# Patient Record
Sex: Female | Born: 1980 | Race: White | Hispanic: No | Marital: Married | Smoking: Never smoker
Health system: Southern US, Community
[De-identification: ages and names within clinical notes are randomized; demographics above are authoritative.]

## PROBLEM LIST (undated history)

## (undated) DIAGNOSIS — Z8481 Family history of carrier of genetic disease: Secondary | ICD-10-CM

## (undated) DIAGNOSIS — O24419 Gestational diabetes mellitus in pregnancy, unspecified control: Secondary | ICD-10-CM

## (undated) DIAGNOSIS — Z803 Family history of malignant neoplasm of breast: Secondary | ICD-10-CM

## (undated) DIAGNOSIS — E119 Type 2 diabetes mellitus without complications: Secondary | ICD-10-CM

## (undated) DIAGNOSIS — I10 Essential (primary) hypertension: Secondary | ICD-10-CM

## (undated) HISTORY — DX: Family history of malignant neoplasm of breast: Z80.3

## (undated) HISTORY — PX: TONSILLECTOMY: SUR1361

## (undated) HISTORY — DX: Family history of carrier of genetic disease: Z84.81

---
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
## 2004-05-06 ENCOUNTER — Ambulatory Visit: Payer: Self-pay | Admitting: Internal Medicine

## 2004-06-10 ENCOUNTER — Emergency Department: Payer: Self-pay | Admitting: Emergency Medicine

## 2004-10-22 ENCOUNTER — Ambulatory Visit: Payer: Self-pay | Admitting: Internal Medicine

## 2009-05-14 ENCOUNTER — Ambulatory Visit: Payer: Self-pay

## 2009-06-22 ENCOUNTER — Observation Stay: Payer: Self-pay | Admitting: Obstetrics & Gynecology

## 2009-06-25 ENCOUNTER — Inpatient Hospital Stay: Payer: Self-pay | Admitting: Obstetrics & Gynecology

## 2009-06-25 ENCOUNTER — Ambulatory Visit: Payer: Self-pay | Admitting: Obstetrics & Gynecology

## 2009-07-03 ENCOUNTER — Inpatient Hospital Stay: Payer: Self-pay | Admitting: Obstetrics and Gynecology

## 2010-10-09 LAB — HM PAP SMEAR

## 2011-12-27 ENCOUNTER — Observation Stay: Payer: Self-pay | Admitting: Obstetrics and Gynecology

## 2011-12-27 LAB — CBC WITH DIFFERENTIAL/PLATELET
Basophil %: 0.2 %
Eosinophil #: 0.1 10*3/uL (ref 0.0–0.7)
Eosinophil %: 0.7 %
Lymphocyte #: 2.2 10*3/uL (ref 1.0–3.6)
MCH: 32.5 pg (ref 26.0–34.0)
MCHC: 34.7 g/dL (ref 32.0–36.0)
MCV: 94 fL (ref 80–100)
Monocyte #: 0.9 x10 3/mm (ref 0.2–0.9)
Platelet: 196 10*3/uL (ref 150–440)
RDW: 13.8 % (ref 11.5–14.5)

## 2012-01-01 ENCOUNTER — Observation Stay: Payer: Self-pay

## 2012-01-01 LAB — CREATININE, URINE, RANDOM: Creatinine, Urine Random: 45 mg/dL (ref 30.0–125.0)

## 2012-01-02 LAB — BASIC METABOLIC PANEL
BUN: 9 mg/dL (ref 7–18)
Calcium, Total: 9.2 mg/dL (ref 8.5–10.1)
Chloride: 105 mmol/L (ref 98–107)
Creatinine: 0.62 mg/dL (ref 0.60–1.30)
EGFR (Non-African Amer.): >60
Glucose: 59 mg/dL — ABNORMAL LOW (ref 65–99)
Osmolality: 276 (ref 275–301)
Potassium: 3.4 mmol/L — ABNORMAL LOW (ref 3.5–5.1)
Sodium: 140 mmol/L (ref 136–145)

## 2012-01-02 LAB — HEMOGLOBIN: HGB: 12.3 g/dL (ref 12.0–16.0)

## 2012-01-02 LAB — URIC ACID: Uric Acid: 3.2 mg/dL (ref 2.6–6.0)

## 2012-01-02 LAB — SGOT (AST)(ARMC): SGOT(AST): 18 U/L (ref 15–37)

## 2012-01-02 LAB — WBC: WBC: 13.5 10*3/uL — ABNORMAL HIGH (ref 3.6–11.0)

## 2012-01-08 ENCOUNTER — Inpatient Hospital Stay: Payer: Self-pay | Admitting: Obstetrics and Gynecology

## 2012-01-08 LAB — CBC WITH DIFFERENTIAL/PLATELET
Basophil #: 0 10*3/uL (ref 0.0–0.1)
Basophil %: 0.1 %
Eosinophil %: 0.4 %
HCT: 33.2 % — ABNORMAL LOW (ref 35.0–47.0)
HGB: 12.2 g/dL (ref 12.0–16.0)
Lymphocyte #: 2.6 10*3/uL (ref 1.0–3.6)
Lymphocyte %: 24 %
MCHC: 36.7 g/dL — ABNORMAL HIGH (ref 32.0–36.0)
MCV: 103 fL — ABNORMAL HIGH (ref 80–100)
Monocyte #: 0.7 x10 3/mm (ref 0.2–0.9)
Monocyte %: 6.8 %
Neutrophil #: 7.4 10*3/uL — ABNORMAL HIGH (ref 1.4–6.5)
Platelet: 219 10*3/uL (ref 150–440)
RBC: 3.21 10*6/uL — ABNORMAL LOW (ref 3.80–5.20)
RDW: 13.6 % (ref 11.5–14.5)
WBC: 10.7 10*3/uL (ref 3.6–11.0)

## 2012-06-16 DIAGNOSIS — Z1371 Encounter for nonprocreative screening for genetic disease carrier status: Secondary | ICD-10-CM

## 2012-06-16 HISTORY — DX: Encounter for nonprocreative screening for genetic disease carrier status: Z13.71

## 2013-03-22 LAB — HM PAP SMEAR

## 2014-10-24 NOTE — H&P (Signed)
L&D Evaluation:  History Expanded:   HPI 34 yo G2P1001 at 37w [facility] 8wk US with GDM this pregnancy, Currently on glyburide 2.5 mg BID with fair control of BG.  Growth has been appropriate with normal AFIs. Pt had version for breech presentation on 7/13. Pt presents this evening with c/o regular cramps - approx q 10 minutes. OBHx notable for IOL for pre-eclampsia with rapid progression from 3 to 10 cm after AROM.    Blood Type (Maternal) O positive    Group B Strep Results Maternal (Result >5wks must be treated as unknown) negative    Maternal HIV Negative    Maternal Syphilis Ab Nonreactive    Maternal Varicella Non-Immune    Rubella Results (Maternal) immune    Presents with contractions    Patient's Medical History No Chronic Illness    Patient's Surgical History anterior vaginal repair    Medications Pre Natal Vitamins  started on glyburide 2.5mg  bid (decreased am dose to 1.25 mg qam on 7/18    Allergies NKDA    Social History none    Family History Non-Contributory   ROS:   ROS All systems were reviewed.  HEENT, CNS, GI, GU, Respiratory, CV, Renal and Musculoskeletal systems were found to be normal.   Exam:   Vital Signs 140s/80s    Urine Protein negative dipstick    General no apparent distress    Mental Status clear    Chest clear    Heart no murmur/gallop/rubs    Abdomen gravid, tender with contractions    Estimated Fetal Weight Average for gestational age    Edema no edema  area of varicose veins on right leg    Reflexes 2+    Clonus negative    Pelvic 5/90/-2    Mebranes Intact    FHT normal rate with no decels    Ucx regular, q 3-10 min   Impression:   Impression IUP at 37 weeks with contractions, elevated BPs   Plan:   Plan monitor contractions and for cervical change, monitor BP, PIH panel   Electronic Signatures: Vella KohlerBrothers, Kagen Kunath K (CNM)  (Signed 18-Jul-13 23:21)  Authored: L&D Evaluation   Last  Updated: 18-Jul-13 23:21 by Vella KohlerBrothers, Emelyn Roen K (CNM)

## 2014-10-24 NOTE — H&P (Signed)
L&D Evaluation:  History:   HPI 34 yo G2P1001 at 7657w2d [facility] with GDM this pregnancy noted to be breech since [redacted] weeks gestation and has not spontaneously verted.  Currently on glyburide with fair control of BG.  Growth has been appropriate with normal AFI yesterday.    Presents with Version    Patient's Medical History No Chronic Illness    Patient's Surgical History anterior vaginal repair    Medications Pre Natal Vitamins  started on glyburide 2.5mg  bid 7/12    Allergies NKDA    Social History none    Family History Non-Contributory   ROS:   ROS All systems were reviewed.  HEENT, CNS, GI, GU, Respiratory, CV, Renal and Musculoskeletal systems were found to be normal.   Exam:   Vital Signs stable    General no apparent distress    Abdomen gravid, non-tender    Estimated Fetal Weight Average for gestational age, breech    Edema no edema    Mebranes Intact    FHT normal rate with no decels    Ucx absent    Other Transabdominal US reveals single viable IUP in frank breech position, AFI of 16.09cm, posterior fundal placenta   Impression:   Impression Breech   Plan:   Comments - Obtain CBC - Administer 0.25mg  SQ once prior to procedure - Will monitor for 1-hr post procedure if no evidence of contractions and reassuing fetal surveillance may discharge home   Electronic Signatures: Lorrene ReidStaebler, Reg Bircher M (MD)  (Signed 13-Jul-13 10:36)  Authored: L&D Evaluation   Last Updated: 13-Jul-13 10:36 by Lorrene ReidStaebler, [place] Leinen M (MD)

## 2015-08-07 ENCOUNTER — Ambulatory Visit
Admission: EM | Admit: 2015-08-07 | Discharge: 2015-08-07 | Disposition: A | Discharge status: Home / Self Care | Payer: Self-pay | Attending: Family Medicine | Admitting: Family Medicine

## 2015-08-07 DIAGNOSIS — K219 Gastro-esophageal reflux disease without esophagitis: Secondary | ICD-10-CM

## 2015-08-07 HISTORY — DX: Gestational diabetes mellitus in pregnancy, unspecified control: O24.419

## 2015-08-07 LAB — COMPREHENSIVE METABOLIC PANEL
ALT: 42 U/L (ref 14–54)
AST: 25 U/L (ref 15–41)
Albumin: 4.3 g/dL (ref 3.5–5.0)
Alkaline Phosphatase: 134 U/L — ABNORMAL HIGH (ref 38–126)
Anion gap: 6 (ref 5–15)
BUN: 11 mg/dL (ref 6–20)
CO2: 27 mmol/L (ref 22–32)
Calcium: 8.8 mg/dL — ABNORMAL LOW (ref 8.9–10.3)
Chloride: 103 mmol/L (ref 101–111)
Creatinine, Ser: 0.67 mg/dL (ref 0.44–1.00)
GFR calc Af Amer: >60 mL/min (ref >60)
GFR calc non Af Amer: >60 mL/min (ref >60)
Glucose, Bld: 94 mg/dL (ref 65–99)
Potassium: 3.8 mmol/L (ref 3.5–5.1)
Sodium: 136 mmol/L (ref 135–145)
Total Bilirubin: 0.5 mg/dL (ref 0.3–1.2)
Total Protein: 8.8 g/dL — ABNORMAL HIGH (ref 6.5–8.1)

## 2015-08-07 LAB — URINALYSIS COMPLETE WITH MICROSCOPIC (ARMC ONLY)
BILIRUBIN URINE: NEGATIVE
GLUCOSE, UA: NEGATIVE mg/dL
Ketones, ur: NEGATIVE mg/dL
LEUKOCYTES UA: NEGATIVE
Nitrite: NEGATIVE
PH: 5.5 (ref 5.0–8.0)
Protein, ur: NEGATIVE mg/dL
Specific Gravity, Urine: 1.015 (ref 1.005–1.030)
WBC UA: NONE SEEN WBC/hpf (ref 0–5)

## 2015-08-07 LAB — CBC WITH DIFFERENTIAL/PLATELET
Basophils Absolute: 0.1 10*3/uL (ref 0–0.1)
Basophils Relative: 1 %
Eosinophils Absolute: 0.2 10*3/uL (ref 0–0.7)
Eosinophils Relative: 2 %
HCT: 42.5 % (ref 35.0–47.0)
Hemoglobin: 14.4 g/dL (ref 12.0–16.0)
Lymphocytes Relative: 40 %
Lymphs Abs: 4.2 10*3/uL — ABNORMAL HIGH (ref 1.0–3.6)
MCH: 30.1 pg (ref 26.0–34.0)
MCHC: 34 g/dL (ref 32.0–36.0)
MCV: 88.7 fL (ref 80.0–100.0)
Monocytes Absolute: 0.7 10*3/uL (ref 0.2–0.9)
Monocytes Relative: 7 %
Neutro Abs: 5.2 10*3/uL (ref 1.4–6.5)
Neutrophils Relative %: 50 %
Platelets: 330 10*3/uL (ref 150–440)
RBC: 4.79 MIL/uL (ref 3.80–5.20)
RDW: 13 % (ref 11.5–14.5)
WBC: 10.4 10*3/uL (ref 3.6–11.0)

## 2015-08-07 LAB — LIPASE, BLOOD: Lipase: 19 U/L (ref 11–51)

## 2015-08-07 MED ORDER — OMEPRAZOLE 20 MG PO CPDR: 20.0000 mg | DELAYED_RELEASE_CAPSULE | Freq: Every day | ORAL | Status: DC

## 2015-08-07 MED ORDER — TRIAMCINOLONE ACETONIDE 0.1 % EX CREA: 1.0000 "application " | TOPICAL_CREAM | Freq: Two times a day (BID) | CUTANEOUS | Status: DC

## 2015-08-07 NOTE — ED Provider Notes (Signed)
CSN: [id number]     Arrival date & time 08/07/15  1201 History   First MD Initiated Contact with Patient 08/07/15 1452     Chief Complaint  Patient presents with  . Abdominal Pain  . Cough   (Consider location/radiation/quality/duration/timing/severity/associated sxs/prior Treatment) HPI  [name] who presents with a three-week history of cough and cold that has lasted for 3 weeks but is improving. However cough is worse at nighttime and is keeping her awake. Her main problem today is that of an epigastric pain and feels like he goes through into her back. She has very little appetite. Pains are sharp and feels this is somewhat is stabbing her. This began 3 days ago. Denies vomiting or diarrhea but has had some nausea. She states that food tends to make it worse about 30 minutes after eating. She denies drinking alcohol.  Past Medical History  Diagnosis Date  . Gestational diabetes    History reviewed. No pertinent past surgical history. Family History  Problem Relation Age of Onset  . Hypertension Mother   . Cancer Mother   . CAD Father    Social History  Substance Use Topics  . Smoking status: Never Smoker   . Smokeless tobacco: None  . Alcohol Use: No   OB History    No data available     Review of Systems  Constitutional: Positive for activity change. Negative for fever, chills and fatigue.  HENT: Positive for postnasal drip, rhinorrhea and sinus pressure.   Respiratory: Positive for cough. Negative for shortness of breath, wheezing and stridor.   Gastrointestinal: Positive for nausea and abdominal pain. Negative for vomiting, diarrhea, constipation, blood in stool and abdominal distention.  All other systems reviewed and are negative.   Allergies  Vicodin  Home Medications   Prior to Admission medications   Medication Sig Start Date End Date Taking? Authorizing Provider  omeprazole (PRILOSEC) 20 MG capsule Take 1 capsule (20 mg total) by mouth  daily. 08/07/15   Lutricia Feil, PA-C  triamcinolone cream (KENALOG) 0.1 % Apply 1 application topically 2 (two) times daily. 08/07/15   Lutricia Feil, PA-C   Meds Ordered and Administered this Visit  Medications - No data to display  BP 138/94 mmHg  Pulse 84  Temp(Src) 98.2 F (36.8 C) (Oral)  Resp 18  Ht  (1.6 m)  Wt 132 lb (59.875 kg)  BMI 23.39 kg/m2  SpO2 100%  LMP 08/05/2015 No data found.   Physical Exam  Constitutional: She is oriented to person, place, and time. She appears well-developed and well-nourished. No distress.  HENT:  Head: Normocephalic and atraumatic.  Right Ear: External ear normal.  Left Ear: External ear normal.  Nose: Nose normal.  Mouth/Throat: Oropharynx is clear and moist. No oropharyngeal exudate.  Eyes: Conjunctivae are normal. Pupils are equal, round, and reactive to light. Right eye exhibits no discharge. Left eye exhibits no discharge.  Neck: Normal range of motion. Neck supple.  Pulmonary/Chest: Effort normal and breath sounds normal. No respiratory distress. She has no wheezes. She has no rales.  Abdominal: Soft. Bowel sounds are normal. She exhibits no distension. There is no tenderness. There is no rebound and no guarding.  Examination of the abdomen was performed in the presence of Okey Regal, radiology technician acting as chaperone. There is no distention seen today. Bowel sounds are present and active. The patient has maximal tenderness in the epigastric area but also in the left upper to mid quadrant. There  is no tenderness in the lower quadrants or suprapubic. There is no guarding, no rebound and no masses felt. No bruits are appreciated. She has a negative Murphy sign.  Musculoskeletal: Normal range of motion. She exhibits no edema or tenderness.  Lymphadenopathy:    She has no cervical adenopathy.  Neurological: She is alert and oriented to person, place, and time.  Skin: Skin is warm and dry. She is not diaphoretic.  Psychiatric:  She has a normal mood and affect. Her behavior is normal. Judgment normal.  Nursing note and vitals reviewed.   ED Course  Procedures (including critical care time)  Labs Review Labs Reviewed  URINALYSIS COMPLETEWITH MICROSCOPIC ([facility] ONLY) - Abnormal; Notable for the following:    Hgb urine dipstick 3+ (*)    Bacteria, UA RARE (*)    Squamous Epithelial / LPF 0-5 (*)    All other components within normal limits  CBC WITH DIFFERENTIAL/PLATELET - Abnormal; Notable for the following:    Lymphs Abs 4.2 (*)    All other components within normal limits  COMPREHENSIVE METABOLIC PANEL - Abnormal; Notable for the following:    Calcium 8.8 (*)    Total Protein 8.8 (*)    Alkaline Phosphatase 134 (*)    All other components within normal limits  URINE CULTURE  LIPASE, BLOOD    Imaging Review No results found.   Visual Acuity Review  Right Eye Distance:   Left Eye Distance:   Bilateral Distance:    Right Eye Near:   Left Eye Near:    Bilateral Near:         MDM   1. Gastroesophageal reflux disease, esophagitis presence not specified    Discharge Medication List as of 08/07/2015  4:11 PM    START taking these medications   Details  omeprazole (PRILOSEC) 20 MG capsule Take 1 capsule (20 mg total) by mouth daily., Starting 08/07/2015, Until Discontinued, Normal    triamcinolone cream (KENALOG) 0.1 % Apply 1 application topically 2 (two) times daily., Starting 08/07/2015, Until Discontinued, Normal      Plan: 1. Test/x-ray results and diagnosis reviewed with patient 2. rx as per orders; risks, benefits, potential side effects reviewed with patient 3. Recommend supportive treatment with proper food choices to minimize her epigastric pain. She is to be followed up by primary care physician and she is actively seeking one at the present time. Her rash is nearly resolved and appears to be eczematous in appearance. Have given her some triamcinolone to help eradicate this fully.  Her cough I have suggested Delsym 12 hour OTC. 4. F/u prn if symptoms worsen or don't improve     Lutricia Feil, PA-C 08/07/15 2246

## 2015-08-07 NOTE — ED Notes (Signed)
Patient c/o upper abdominal pain which goes all the way through to her back.  Taking deep breaths in make her pain worst.  This has been going on since Saturday.  Also, c/o cough, nasal congestion, sore throat coming and going, and nausea which all started last week.  Lastly, she states that she also has a rash in the back of her knees and inner thighs which she noticed a week and a half ago.

## 2015-08-07 NOTE — Discharge Instructions (Signed)

## 2015-08-09 LAB — URINE CULTURE

## 2020-10-02 ENCOUNTER — Encounter: Payer: Self-pay | Admitting: Obstetrics and Gynecology

## 2020-10-02 DIAGNOSIS — Z803 Family history of malignant neoplasm of breast: Secondary | ICD-10-CM | POA: Insufficient documentation

## 2020-10-02 DIAGNOSIS — Z9189 Other specified personal risk factors, not elsewhere classified: Secondary | ICD-10-CM | POA: Insufficient documentation

## 2020-10-02 NOTE — Patient Instructions (Addendum)
I value your feedback and you entrusting us with your care. If you get a [facility] patient survey, I would appreciate you taking the time to let us know about your experience today. Thank you!  [facility]: [phone]  [facility]: [phone]    

## 2020-10-02 NOTE — Progress Notes (Signed)
PCP:  Marisue Ivan, MD   Chief Complaint  Patient presents with  . Gynecologic Exam    Pt says she has trouble holding urine     HPI:      Desiree Woodward is a 40 y.o. No obstetric history on file. whose LMP was Patient's last menstrual period was 09/23/2020 (approximate)., presents today for her NP> 3 yrs annual examination.  Her menses are regular every 28-30 days, lasting 5-7 days.  Dysmenorrhea none. She does not have intermenstrual bleeding.  Sex activity: single partner, contraception - vasectomy.  Last Pap: 10/09/10  Results were: no abnormalities ; no hx of abn paps  Last mammogram: 04/27/13  Results were: normal--routine follow-up in 12 months There is a FH of breast cancer in her mom, MGM and PGM. There is no FH of ovarian cancer. Pt is BRCA neg 2014 (no panel testing done). Maternal cousin had + BRCA testing, but unsure if 1 or 2. Pt's mom hasn't had genetic testing done that pt knows of. Pt qualifies for full panel testing due to FH of PGM. The patient does do self-breast exams.  Tobacco use: The patient denies current or previous tobacco use. Alcohol use: none No drug use.  Exercise: not active  She does get adequate calcium but not Vitamin D in her diet.  Has issues with SUI and urge incont, also postvoid dribbling and leakage if bending over. Pt wears pad and sometimes has incont without sensation of needing to void. 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 Drinks 1 caffeine drink daily.   Past Medical History:  Diagnosis Date  . BRCA negative 2014   [facility]  . Family history of breast cancer   . Gestational diabetes   . Increased risk of breast cancer 07/1999   IBIS=24.6%    History reviewed. No pertinent surgical history.  Family History  Problem Relation Age of Onset  . Hypertension Mother   . Breast cancer Mother 54       mat cousin is BRCA pos; mom hasn't done genetic testing  . CAD Father   .  Pancreatitis Father   . Breast cancer Maternal Grandmother        55s  . Lymphoma Maternal Grandmother   . Breast cancer Paternal Grandmother        70s    Social History   Socioeconomic History  . Marital status: Married    Spouse name: Not on file  . Number of children: Not on file  . Years of education: Not on file  . Highest education level: Not on file  Occupational History  . Not on file  Tobacco Use  . Smoking status: Never Smoker  . Smokeless tobacco: Never Used  Vaping Use  . Vaping Use: Never used  Substance and Sexual Activity  . Alcohol use: No  . Drug use: Never  . Sexual activity: Yes    Birth control/protection: Surgical    Comment: vasectomy  Other Topics Concern  . Not on file  Social History Narrative  . Not on file   Social Determinants of Health   Financial Resource Strain: Not on file  Food Insecurity: Not on file  Transportation Needs: Not on file  Physical Activity: Not on file  Stress: Not on file  Social Connections: Not on file  Intimate Partner Violence: Not on file    No current outpatient medications on file.     ROS:  Review of  Systems  Constitutional: Negative for fatigue, fever and unexpected weight change.  Respiratory: Negative for cough, shortness of breath and wheezing.   Cardiovascular: Negative for chest pain, palpitations and leg swelling.  Gastrointestinal: Negative for blood in stool, constipation, diarrhea, nausea and vomiting.  Endocrine: Negative for cold intolerance, heat intolerance and polyuria.  Genitourinary: Positive for difficulty urinating. Negative for dyspareunia, dysuria, flank pain, frequency, genital sores, hematuria, menstrual problem, pelvic pain, urgency, vaginal bleeding, vaginal discharge and vaginal pain.  Musculoskeletal: Negative for back pain, joint swelling and myalgias.  Skin: Negative for rash.  Neurological: Negative for dizziness, syncope, light-headedness, numbness and headaches.   Hematological: Negative for adenopathy.  Psychiatric/Behavioral: Negative for agitation, confusion, sleep disturbance and suicidal ideas. The patient is not nervous/anxious.    BREAST: No symptoms   Objective: BP 138/90   Ht $R'5\' 2"'AE$  (1.575 m)   Wt 150 lb (68 kg)   LMP 09/23/2020 (Approximate)   BMI 27.44 kg/m    Physical Exam Constitutional:      Appearance: She is well-developed.  Genitourinary:     Vulva and bladder normal.     Right Labia: No rash, tenderness or lesions.    Left Labia: No tenderness, lesions or rash.    No vaginal discharge, erythema or tenderness.     No vaginal prolapse present.    No vaginal atrophy present.     Right Adnexa: not tender and no mass present.    Left Adnexa: not tender and no mass present.    No cervical friability or polyp.     Uterus is not enlarged or tender.     No urethral prolapse present.  Breasts:     Right: No mass, nipple discharge, skin change or tenderness.     Left: No mass, nipple discharge, skin change or tenderness.    Neck:     Thyroid: No thyromegaly.  Cardiovascular:     Rate and Rhythm: Normal rate and regular rhythm.     Heart sounds: Normal heart sounds. No murmur heard.   Pulmonary:     Effort: Pulmonary effort is normal.     Breath sounds: Normal breath sounds.  Abdominal:     Palpations: Abdomen is soft.     Tenderness: There is no abdominal tenderness. There is no guarding or rebound.  Musculoskeletal:        General: Normal range of motion.     Cervical back: Normal range of motion.  Lymphadenopathy:     Cervical: No cervical adenopathy.  Neurological:     General: No focal deficit present.     Mental Status: She is alert and oriented to person, place, and time.     Cranial Nerves: No cranial nerve deficit.  Skin:    General: Skin is warm and dry.  Psychiatric:        Mood and Affect: Mood normal.        Behavior: Behavior normal.        Thought Content: Thought content normal.         Judgment: Judgment normal.  Vitals reviewed.     Assessment/Plan: Encounter for annual routine gynecological examination  Cervical cancer screening - Plan: Cytology - PAP  Screening for HPV (human papillomavirus) - Plan: Cytology - PAP  Encounter for screening mammogram for malignant neoplasm of breast - Plan: MM 3D SCREEN BREAST BILATERAL; pt to sched mammo  Family history of breast cancer - Plan: MM 3D SCREEN BREAST BILATERAL; Integrated [employer]AC[employer] ([place]); MyRisk update testing  discussed and done today. Encouraged pt's mom to do genetic tetsing; mat cousin is BRCA positive. Pt qualifies for testing on pat side too.   Mixed incontinence - Plan: Ambulatory referral to Urology; refer to urology due to complicated bladder hx.   Urinary incontinence without sensory awareness            GYN counsel breast self exam, mammography screening, adequate intake of calcium and vitamin D, diet and exercise     F/U  Return in about 1 year (around 10/03/2021).  Fraser Busche B. Jocelin Schuelke, PA-C 10/03/2020 9:35 AM

## 2020-10-03 ENCOUNTER — Other Ambulatory Visit (HOSPITAL_COMMUNITY)
Admission: RE | Admit: 2020-10-03 | Discharge: 2020-10-03 | Disposition: A | Discharge status: Home / Self Care | Payer: BC Managed Care – PPO | Source: Ambulatory Visit | Attending: Obstetrics and Gynecology | Admitting: Obstetrics and Gynecology

## 2020-10-03 ENCOUNTER — Encounter: Payer: Self-pay | Admitting: Obstetrics and Gynecology

## 2020-10-03 ENCOUNTER — Ambulatory Visit (INDEPENDENT_AMBULATORY_CARE_PROVIDER_SITE_OTHER): Payer: BC Managed Care – PPO | Admitting: Obstetrics and Gynecology

## 2020-10-03 ENCOUNTER — Other Ambulatory Visit: Payer: Self-pay

## 2020-10-03 VITALS — BP 138/90 | Ht 62.0 in | Wt 150.0 lb

## 2020-10-03 DIAGNOSIS — Z803 Family history of malignant neoplasm of breast: Secondary | ICD-10-CM | POA: Diagnosis not present

## 2020-10-03 DIAGNOSIS — Z9189 Other specified personal risk factors, not elsewhere classified: Secondary | ICD-10-CM

## 2020-10-03 DIAGNOSIS — N3946 Mixed incontinence: Secondary | ICD-10-CM | POA: Diagnosis not present

## 2020-10-03 DIAGNOSIS — Z01419 Encounter for gynecological examination (general) (routine) without abnormal findings: Secondary | ICD-10-CM | POA: Diagnosis not present

## 2020-10-03 DIAGNOSIS — N3942 Incontinence without sensory awareness: Secondary | ICD-10-CM

## 2020-10-03 DIAGNOSIS — Z124 Encounter for screening for malignant neoplasm of cervix: Secondary | ICD-10-CM | POA: Diagnosis present

## 2020-10-03 DIAGNOSIS — Z1151 Encounter for screening for human papillomavirus (HPV): Secondary | ICD-10-CM

## 2020-10-03 DIAGNOSIS — Z1231 Encounter for screening mammogram for malignant neoplasm of breast: Secondary | ICD-10-CM

## 2020-10-04 LAB — CYTOLOGY - PAP
Comment: NEGATIVE
Diagnosis: NEGATIVE
High risk HPV: NEGATIVE

## 2020-10-05 ENCOUNTER — Other Ambulatory Visit: Payer: Self-pay

## 2020-10-05 ENCOUNTER — Emergency Department
Admission: EM | Admit: 2020-10-05 | Discharge: 2020-10-05 | Disposition: A | Discharge status: Home / Self Care | Payer: BC Managed Care – PPO | Attending: Emergency Medicine | Admitting: Emergency Medicine

## 2020-10-05 DIAGNOSIS — R030 Elevated blood-pressure reading, without diagnosis of hypertension: Secondary | ICD-10-CM | POA: Diagnosis present

## 2020-10-05 DIAGNOSIS — I10 Essential (primary) hypertension: Secondary | ICD-10-CM | POA: Diagnosis not present

## 2020-10-05 DIAGNOSIS — Z79899 Other long term (current) drug therapy: Secondary | ICD-10-CM | POA: Diagnosis not present

## 2020-10-05 LAB — CBC WITH DIFFERENTIAL/PLATELET
Abs Immature Granulocytes: 0.02 10*3/uL (ref 0.00–0.07)
Basophils Absolute: 0 10*3/uL (ref 0.0–0.1)
Basophils Relative: 0 %
Eosinophils Absolute: 0.1 10*3/uL (ref 0.0–0.5)
Eosinophils Relative: 1 %
HCT: 46.4 % — ABNORMAL HIGH (ref 36.0–46.0)
Hemoglobin: 15.4 g/dL — ABNORMAL HIGH (ref 12.0–15.0)
Immature Granulocytes: 0 %
Lymphocytes Relative: 20 %
Lymphs Abs: 1.9 10*3/uL (ref 0.7–4.0)
MCH: 30.1 pg (ref 26.0–34.0)
MCHC: 33.2 g/dL (ref 30.0–36.0)
MCV: 90.6 fL (ref 80.0–100.0)
Monocytes Absolute: 0.5 10*3/uL (ref 0.1–1.0)
Monocytes Relative: 5 %
Neutro Abs: 7 10*3/uL (ref 1.7–7.7)
Neutrophils Relative %: 74 %
Platelets: 311 10*3/uL (ref 150–400)
RBC: 5.12 MIL/uL — ABNORMAL HIGH (ref 3.87–5.11)
RDW: 12.7 % (ref 11.5–15.5)
WBC: 9.5 10*3/uL (ref 4.0–10.5)
nRBC: 0 % (ref 0.0–0.2)

## 2020-10-05 LAB — MAGNESIUM: Magnesium: 1.9 mg/dL (ref 1.7–2.4)

## 2020-10-05 LAB — BASIC METABOLIC PANEL
Anion gap: 10 (ref 5–15)
BUN: 12 mg/dL (ref 6–20)
CO2: 27 mmol/L (ref 22–32)
Calcium: 9.2 mg/dL (ref 8.9–10.3)
Chloride: 102 mmol/L (ref 98–111)
Creatinine, Ser: 1.02 mg/dL — ABNORMAL HIGH (ref 0.44–1.00)
GFR, Estimated: >60 mL/min (ref >60)
Glucose, Bld: 118 mg/dL — ABNORMAL HIGH (ref 70–99)
Potassium: 3.6 mmol/L (ref 3.5–5.1)
Sodium: 139 mmol/L (ref 135–145)

## 2020-10-05 LAB — T4, FREE: Free T4: 1.06 ng/dL (ref 0.61–1.12)

## 2020-10-05 LAB — TSH: TSH: 0.942 u[IU]/mL (ref 0.350–4.500)

## 2020-10-05 MED ORDER — SODIUM CHLORIDE 0.9 % IV BOLUS
1000.0000 mL | Freq: Once | INTRAVENOUS | Status: AC
  Administered 2020-10-05: 1000 mL via INTRAVENOUS

## 2020-10-05 MED ORDER — AMLODIPINE BESYLATE 10 MG PO TABS: 10.0000 mg | ORAL_TABLET | Freq: Every day | ORAL | 1 refills | Status: DC

## 2020-10-05 MED ORDER — AMLODIPINE BESYLATE 5 MG PO TABS: 5.0000 mg | ORAL_TABLET | Freq: Every day | ORAL | 1 refills | Status: DC

## 2020-10-05 NOTE — ED Notes (Signed)
Assigned as Primary RN at this time. First encounter with the patient. Pt is lying comfortably and in no acute distress. Will continue to monitor and assess.  

## 2020-10-05 NOTE — ED Provider Notes (Signed)
[facility] Emergency Department Provider Note  ____________________________________________  Time seen: Approximately 2:10 PM  I have reviewed the triage vital signs and the nursing notes.   HISTORY  Chief Complaint Hypertension    HPI Desiree Woodward is a 40 y.o. female with no significant past medical history who comes ED complaining of elevated blood pressure for the past 6 weeks, intermittently associate with some dizziness and blurred vision.  No chest pain or shortness of breath.  Reports some palpitations as well.  No abdominal pain or back pain, no headache.  No paresthesias or motor weakness.  Currently no acute symptoms other than fatigue.     Past Medical History:  Diagnosis Date  . BRCA negative 2014   [facility]  . Family history of breast cancer   . Gestational diabetes   . Increased risk of breast cancer 07/1999   IBIS=24.6%     Patient Active Problem List   Diagnosis Date Noted  . Mixed incontinence 10/03/2020  . Urinary incontinence without sensory awareness 10/03/2020  . Family history of breast cancer 10/02/2020  . Increased risk of breast cancer 10/02/2020     Past Surgical History:  Procedure Laterality Date  . TONSILLECTOMY       Prior to Admission medications   Medication Sig Start Date End Date Taking? Authorizing Provider  amLODipine (NORVASC) 5 MG tablet Take 1 tablet (5 mg total) by mouth daily. 10/05/20  Yes Carrie Mew, MD     Allergies Vicodin [hydrocodone-acetaminophen]   Family History  Problem Relation Age of Onset  . Hypertension Mother   . Breast cancer Mother 70       mat cousin is BRCA pos; mom hasn't done genetic testing  . CAD Father   . Pancreatitis Father   . Breast cancer Maternal Grandmother        83s  . Lymphoma Maternal Grandmother   . Breast cancer Paternal Grandmother        74s    Social History Social History   Tobacco Use  . Smoking status: Never Smoker  . Smokeless  tobacco: Never Used  Vaping Use  . Vaping Use: Never used  Substance Use Topics  . Alcohol use: No  . Drug use: Never    Review of Systems  Constitutional:   No fever or chills.  ENT:   No sore throat. No rhinorrhea. Cardiovascular:   No chest pain or syncope. Respiratory:   No dyspnea or cough. Gastrointestinal:   Negative for abdominal pain, vomiting and diarrhea.  Musculoskeletal:   Negative for focal pain or swelling All other systems reviewed and are negative except as documented above in ROS and HPI.  ____________________________________________   PHYSICAL EXAM:  VITAL SIGNS: ED Triage Vitals  Enc Vitals Group     BP 10/05/20 1248 (!) 183/119     Pulse Rate 10/05/20 1248 (!) 113     Resp 10/05/20 1248 18     Temp 10/05/20 1248 98.5 F (36.9 C)     Temp Source 10/05/20 1248 Oral     SpO2 10/05/20 1248 100 %     Weight 10/05/20 1249 150 lb (68 kg)     Height 10/05/20 1249 '5\' 2"'  (1.575 m)     Head Circumference --      Peak Flow --      Pain Score 10/05/20 1249 0     Pain Loc --      Pain Edu? --      Excl. in  [place]? --     Vital signs reviewed, nursing assessments reviewed.   Constitutional:   Alert and oriented. Non-toxic appearance. Eyes:   Conjunctivae are normal. EOMI. PERRL. ENT      Head:   Normocephalic and atraumatic.      Nose:   Wearing a mask.      Mouth/Throat:   Wearing a mask.      Neck:   No meningismus. Full ROM. Hematological/Lymphatic/Immunilogical:   No cervical lymphadenopathy. Cardiovascular:   Tachycardia heart rate 120. Symmetric bilateral radial and DP pulses.  No murmurs. Cap refill less than 2 seconds. Respiratory:   Normal respiratory effort without tachypnea/retractions. Breath sounds are clear and equal bilaterally. No wheezes/rales/rhonchi. Gastrointestinal:   Soft and nontender. Non distended. There is no CVA tenderness.  No rebound, rigidity, or guarding. Genitourinary:   deferred Musculoskeletal:   Normal range of motion in  all extremities. No joint effusions.  No lower extremity tenderness.  No edema. Neurologic:   Normal speech and language.  Motor grossly intact. No acute focal neurologic deficits are appreciated.  Skin:    Skin is warm, dry and intact. No rash noted.  No petechiae, purpura, or bullae.  ____________________________________________    LABS (pertinent positives/negatives) (all labs ordered are listed, but only abnormal results are displayed) Labs Reviewed  BASIC METABOLIC PANEL - Abnormal; Notable for the following components:      Result Value   Glucose, Bld 118 (*)    Creatinine, Ser 1.02 (*)    All other components within normal limits  CBC WITH DIFFERENTIAL/PLATELET - Abnormal; Notable for the following components:   RBC 5.12 (*)    Hemoglobin 15.4 (*)    HCT 46.4 (*)    All other components within normal limits  T4, FREE  TSH  MAGNESIUM   ____________________________________________   EKG  Interpreted by me Sinus tachycardia rate 102.  Normal axis and intervals.  Poor R wave progression.  Normal ST segments and T waves.  ____________________________________________    RADIOLOGY  No results found.  ____________________________________________   PROCEDURES Procedures  ____________________________________________  DIFFERENTIAL DIAGNOSIS   Electrolyte normality, dehydration, hyperthyroidism, essential hypertension  CLINICAL IMPRESSION / ASSESSMENT AND PLAN / ED COURSE  Medications ordered in the ED: Medications  sodium chloride 0.9 % bolus 1,000 mL (1,000 mLs Intravenous New Bag/Given 10/05/20 1324)    Pertinent labs & imaging results that were available during my care of the patient were reviewed by me and considered in my medical decision making (see chart for details).  Desiree Woodward was evaluated in Emergency Department on 10/05/2020 for the symptoms described in the history of present illness. She was evaluated in the context of the global COVID-19  pandemic, which necessitated consideration that the patient might be at risk for infection with the SARS-CoV-2 virus that causes COVID-19. Institutional protocols and algorithms that pertain to the evaluation of patients at risk for COVID-19 are in a state of rapid change based on information released by regulatory bodies including the CDC and federal and state organizations. These policies and algorithms were followed during the patient's care in the ED.   Patient presents with elevated blood pressure, found to be tachycardic as well.  There is some mild thyroid fullness on exam.  Labs obtained which are reassuring.  IV fluids given with some improvement in heart rate.  Blood pressure has improved as well to 155/100.  We will start Norvasc 5 mg and recommend outpatient follow-up in a week.  Clinical Course  as of 10/05/20 1514  Fri Oct 05, 2020  1513 Work-up reassuring, labs normal.  Patient feeling little better, heart rate improved with IV fluids.  Stable for discharge, will start amlodipine 5 mg. [PS]    Clinical Course User Index [PS] Carrie Mew, MD     ____________________________________________   FINAL CLINICAL IMPRESSION(S) / ED DIAGNOSES    Final diagnoses:  Hypertension, unspecified type     ED Discharge Orders         Ordered    amLODipine (NORVASC) 10 MG tablet  Daily,   Status:  Discontinued        10/05/20 1409    amLODipine (NORVASC) 5 MG tablet  Daily        10/05/20 1410          Portions of this note were generated with dragon dictation software. Dictation errors may occur despite best attempts at proofreading.   Carrie Mew, MD 10/05/20 [phone]

## 2020-10-05 NOTE — ED Triage Notes (Signed)
Pt arrives via pov from home. Ambulatory to treatment room. Pt report BP trending up over last 1.5 months. Pt report she has has some intermittent dizziness, and blurred vision since she noticed BP was trending up, but denies at this time. No hx of HTN, does not take medications.  Pt a&o NAD noted

## 2020-10-16 DIAGNOSIS — I10 Essential (primary) hypertension: Secondary | ICD-10-CM | POA: Insufficient documentation

## 2020-10-23 ENCOUNTER — Encounter: Payer: Self-pay | Admitting: Obstetrics and Gynecology

## 2020-10-29 ENCOUNTER — Telehealth: Payer: Self-pay | Admitting: Obstetrics and Gynecology

## 2020-10-29 NOTE — Telephone Encounter (Signed)
Pt aware of neg MyRisk results. No TC/riskscore done.  Will start mammos age 40.   Patient understands these results only apply to her and her children, and this is not indicative of genetic testing results of her other family members. It is recommended that her other family members have genetic testing done.  Pt also understands negative genetic testing doesn't mean she will never get any of these cancers.   Hard copy mailed to pt. F/u prn.

## 2020-11-19 ENCOUNTER — Ambulatory Visit: Payer: Self-pay | Admitting: Urology

## 2021-02-27 DIAGNOSIS — K219 Gastro-esophageal reflux disease without esophagitis: Secondary | ICD-10-CM | POA: Insufficient documentation

## 2021-03-06 ENCOUNTER — Emergency Department
Admission: EM | Admit: 2021-03-06 | Discharge: 2021-03-06 | Disposition: A | Discharge status: Home / Self Care | Payer: BC Managed Care – PPO | Attending: Emergency Medicine | Admitting: Emergency Medicine

## 2021-03-06 ENCOUNTER — Other Ambulatory Visit: Payer: Self-pay

## 2021-03-06 ENCOUNTER — Ambulatory Visit
Admission: EM | Admit: 2021-03-06 | Discharge: 2021-03-06 | Disposition: A | Discharge status: Home / Self Care | Payer: BC Managed Care – PPO

## 2021-03-06 ENCOUNTER — Encounter: Payer: Self-pay | Admitting: *Deleted

## 2021-03-06 ENCOUNTER — Encounter: Payer: Self-pay | Admitting: Emergency Medicine

## 2021-03-06 DIAGNOSIS — S0511XA Contusion of eyeball and orbital tissues, right eye, initial encounter: Secondary | ICD-10-CM | POA: Diagnosis not present

## 2021-03-06 DIAGNOSIS — Z79899 Other long term (current) drug therapy: Secondary | ICD-10-CM | POA: Insufficient documentation

## 2021-03-06 DIAGNOSIS — I10 Essential (primary) hypertension: Secondary | ICD-10-CM | POA: Insufficient documentation

## 2021-03-06 DIAGNOSIS — Y9389 Activity, other specified: Secondary | ICD-10-CM | POA: Diagnosis not present

## 2021-03-06 DIAGNOSIS — E119 Type 2 diabetes mellitus without complications: Secondary | ICD-10-CM | POA: Diagnosis not present

## 2021-03-06 DIAGNOSIS — J019 Acute sinusitis, unspecified: Secondary | ICD-10-CM | POA: Diagnosis not present

## 2021-03-06 DIAGNOSIS — S0993XA Unspecified injury of face, initial encounter: Secondary | ICD-10-CM | POA: Diagnosis present

## 2021-03-06 DIAGNOSIS — W548XXA Other contact with dog, initial encounter: Secondary | ICD-10-CM | POA: Insufficient documentation

## 2021-03-06 HISTORY — DX: Essential (primary) hypertension: I10

## 2021-03-06 HISTORY — DX: Type 2 diabetes mellitus without complications: E11.9

## 2021-03-06 MED ORDER — CEFDINIR 300 MG PO CAPS: 300.0000 mg | ORAL_CAPSULE | Freq: Two times a day (BID) | ORAL | 0 refills | Status: AC

## 2021-03-06 MED ORDER — PREDNISONE 10 MG PO TABS: ORAL_TABLET | ORAL | 0 refills | Status: AC

## 2021-03-06 NOTE — ED Provider Notes (Signed)
[facility] Emergency Department Provider Note   ____________________________________________    I have reviewed the triage vital signs and the nursing notes.   HISTORY  Chief Complaint Facial Injury     HPI Desiree Woodward is a 40 y.o. female who reports she was playing with her dog and her dog accidentally head butted her in the right eye.  She reports significant swelling in the area.  This occurred just prior to arrival.  No nasal injury.  No pain with eye movements.  No other injuries reported.  Not on blood thinners  Past Medical History:  Diagnosis Date   BRCA negative 2014   2014 BRCA/5/22 MyRisk neg   Diabetes mellitus without complication ([place])    Family history of breast cancer    FHx: BRCA2 gene positive    cousin; pt is BRCA neg   Hypertension    Increased risk of breast cancer 07/1999   IBIS=24.6%    Patient Active Problem List   Diagnosis Date Noted   Mixed incontinence 10/03/2020   Urinary incontinence without sensory awareness 10/03/2020   Family history of breast cancer 10/02/2020   Increased risk of breast cancer 10/02/2020    Past Surgical History:  Procedure Laterality Date   TONSILLECTOMY      Prior to Admission medications   Medication Sig Start Date End Date Taking? Authorizing Provider  amLODipine (NORVASC) 5 MG tablet Take 1 tablet (5 mg total) by mouth daily. Patient not taking: Reported on 03/06/2021 10/05/20   Carrie Mew, MD  cefdinir (OMNICEF) 300 MG capsule Take 1 capsule (300 mg total) by mouth 2 (two) times daily for 7 days. 03/06/21 03/13/21  Boddu, Erasmo Downer, FNP  hydrochlorothiazide (HYDRODIURIL) 25 MG tablet Take 25 mg by mouth daily. 02/27/21   [provider]  predniSONE (DELTASONE) 10 MG tablet Take 6 tablets (60 mg total) by mouth daily for 1 day, THEN 5 tablets (50 mg total) daily for 1 day, THEN 4 tablets (40 mg total) daily for 1 day, THEN 3 tablets (30 mg total) daily for 1 day, THEN  2 tablets (20 mg total) daily for 1 day, THEN 1 tablet (10 mg total) daily for 1 day. 03/06/21 03/12/21  Serafina Royals, FNP     Allergies Vicodin [hydrocodone-acetaminophen]  Family History  Problem Relation Age of Onset   Hypertension Mother    Breast cancer Mother 69       mat cousin is BRCA pos; mom hasn't done genetic testing   CAD Father    Pancreatitis Father    Breast cancer Maternal Grandmother        68s   Lymphoma Maternal Grandmother    Breast cancer Paternal Grandmother        96s   Other Cousin        BRCA+    Social History Social History   Tobacco Use   Smoking status: Never   Smokeless tobacco: Never  Vaping Use   Vaping Use: Never used  Substance Use Topics   Alcohol use: No   Drug use: Never    Review of Systems  Constitutional: No dizziness  ENT: As above    Skin: Negative for laceration Neurological: Negative for headaches     ____________________________________________   PHYSICAL EXAM:  VITAL SIGNS: ED Triage Vitals  Enc Vitals Group     BP 03/06/21 2057 (!) 176/116     Pulse Rate 03/06/21 2057 85     Resp 03/06/21 2057 16  Temp 03/06/21 2057 98.8 F (37.1 C)     Temp Source 03/06/21 2057 Oral     SpO2 03/06/21 2057 99 %     Weight 03/06/21 2058 68 kg (149 lb 14.6 oz)     Height 03/06/21 2058 1.575 m ($Remove'5\' 2"'mpbCIHA$ )     Head Circumference --      Peak Flow --      Pain Score 03/06/21 2057 5     Pain Loc --      Pain Edu? --      Excl. in [place]? --      Constitutional: Alert and oriented. Eyes: Right orbit: Hematoma right eyelid, normal range of motion, normal pupil, normal conjunctiva, no bony abnormalities of the orbit, no tenderness of the bridge of the nose.  No epistaxis. Head: As above Nose: No epistaxis Mouth/Throat: Mucous membranes are moist.   Cardiovascular: Normal rate, regular rhythm.  Respiratory: Normal respiratory effort.  No retractions.  Musculoskeletal: No lower extremity tenderness nor edema.    Neurologic:  Normal speech and language. No gross focal neurologic deficits are appreciated.   Skin:  Skin is warm, dry and intact. No rash noted.   ____________________________________________   LABS (all labs ordered are listed, but only abnormal results are displayed)  Labs Reviewed - No data to display ____________________________________________  EKG   ____________________________________________  RADIOLOGY   ____________________________________________   PROCEDURES  Procedure(s) performed: No  Procedures   Critical Care performed: No ____________________________________________   INITIAL IMPRESSION / ASSESSMENT AND PLAN / ED COURSE  Pertinent labs & imaging results that were available during my care of the patient were reviewed by me and considered in my medical decision making (see chart for details).   Patient well-appearing and in no acute distress.  Exam is overall reassuring, consistent with hematoma to the right orbit/black eye.  Recommend supportive care outpatient follow-up as needed peer   ____________________________________________   FINAL CLINICAL IMPRESSION(S) / ED DIAGNOSES  Final diagnoses:  Traumatic hematoma of right orbit, initial encounter      NEW MEDICATIONS STARTED DURING THIS VISIT:  New Prescriptions   No medications on file     Note:  This document was prepared using Dragon voice recognition software and may include unintentional dictation errors.    Lavonia Drafts, MD 03/06/21 2136

## 2021-03-06 NOTE — Discharge Instructions (Addendum)
Start taking prednisone as prescribed today.  Only fill your Cefdinir (antibiotic) prescription if symptoms do not improve over the next 7 days or if they acutely worsen.  Sinusitis often follows a cold. It causes pain and pressure in your head and face.  Rest, push lots of fluids (especially water), and utilize supportive care for symptoms. Breathe warm, moist area from a steamy shower, hot bath, or sink filled with hot water.  Avoid cold, dry air.  Using a humidifier in your home may help.  Follow the directions for cleaning the machine. Put a hot, wet towel or a warm gel pack on your face 3-4 times a day for 5-10 minutes each time. You may take acetaminophen (Tylenol) every 4-6 hours and ibuprofen every 6-8 hours for muscle pain, joint pain, headaches (you may also alternate these medications). Sudafed (pseudophedrine) is sold behind the counter and can help reduce nasal pressure; avoid taking this if you have high blood pressure or feel jittery. Sudafed PE (phenylephrine) can be a helpful, short-term, over-the-counter alternative to limit side effects or if you have high blood pressure.  Flonase nasal spray can help alleviate congestion and sinus pressure. Many patients choose Afrin as a nasal decongestant; do not use for more than 3 days for risk of rebound (increased symptoms after stopping medication).  Saline nasal sprays or rinses can also help nasal congestion (use bottled or sterile water). Warm tea with lemon and honey can sooth sore throat and cough, as can cough drops.   Return to clinic for new or worse swelling or redness in your face or around your eyes, or if you have a new or higher fever.

## 2021-03-06 NOTE — ED Triage Notes (Signed)
Patient reports she thinks she has a sinus infection.  Reports a lot of drainage in throat, blowing and coughing up green phlegm.  Throat is sore from drainage.  Patient has been sneezing.   Sneezing started Monday and symptoms have progressed

## 2021-03-06 NOTE — ED Triage Notes (Addendum)
Pt reports being "head butt" by her dog this evening. Swelling to the right eye lid without redness to the eye. Pt unable to open eye due to swelling. No LOC. Pt reports swelling was immediate.   Pt has been going to PCP for blood pressure management.

## 2021-03-06 NOTE — ED Provider Notes (Signed)
CHIEF COMPLAINT:   Chief Complaint  Patient presents with   URI     SUBJECTIVE/HPI:   URI A very pleasant 40 y.o.Female presents today with concerns for sinus infection.  Patient reports lots of postnasal drip, coughing up green phlegm.  Patient reports her throat being sore from drainage.  Patient reports that she has been sneezing.  Patient states that symptoms started on Monday and have progressively gotten worse. Patient does not report any shortness of breath, chest pain, palpitations, visual changes, weakness, tingling, headache, nausea, vomiting, diarrhea, fever, chills.   has a past medical history of BRCA negative (2014), Diabetes mellitus without complication ([place]), Family history of breast cancer, FHx: BRCA2 gene positive, Hypertension, and Increased risk of breast cancer (07/1999).  ROS:  Review of Systems See Subjective/HPI Medications, Allergies and Problem List personally reviewed in Epic today OBJECTIVE:   Vitals:   03/06/21 1429  BP: (!) 130/96  Pulse: 96  Resp: 16  Temp: 99.1 F (37.3 C)  SpO2: 96%    Physical Exam   General: Appears well-developed and well-nourished. No acute distress.  HEENT Head: Normocephalic and atraumatic.  No frontal, maxillary sinus tenderness noted to palpation. Ears: Hearing grossly intact, no drainage or visible deformity.  Nose: No nasal deviation.  Erythematous and congested nasal mucosa noted bilaterally. Mouth/Throat: No stridor or tracheal deviation.  Non erythematous posterior pharynx noted with clear drainage present.  No white patchy exudate noted. Eyes: Conjunctivae and EOM are normal. No eye drainage or scleral icterus bilaterally.  Neck: Normal range of motion, neck is supple. No cervical, tonsillar or submandibular lymph nodes palpated.   Cardiovascular: Normal rate. Regular rhythm; no murmurs, gallops, or rubs.  Pulm/Chest: No respiratory distress. Breath sounds normal bilaterally without wheezes, rhonchi, or rales.   Neurological: Alert and oriented to person, place, and time.  Skin: Skin is warm and dry.  No rashes, lesions, abrasions or bruising noted to skin.   Psychiatric: Normal mood, affect, behavior, and thought content.   Vital signs and nursing note reviewed.   Patient stable and cooperative with examination. PROCEDURES:    LABS/X-RAYS/EKG/MEDS:   No results found for any visits on 03/06/21.  MEDICAL DECISION MAKING:   Patient presents with concerns for sinus infection.  Patient reports lots of postnasal drip, coughing up green phlegm.  Patient reports her throat being sore from drainage.  Patient reports that she has been sneezing.  Patient states that symptoms started on Monday and have progressively gotten worse. Patient does not report any shortness of breath, chest pain, palpitations, visual changes, weakness, tingling, headache, nausea, vomiting, diarrhea, fever, chills.  Given symptoms along with assessment findings, likely sinusitis.  Did advise the patient that we would Rx prednisone to her preferred pharmacy and I did print a fold and hold prescription for cefdinir as she does report that she has a history of sinus infections.  Advised that she does not need to fill this medication unless she is continuing to have symptoms or they acutely worsen within a 7-day period.  Advised to rest, push fluids, use of Tylenol versus ibuprofen as needed, Sudafed, Mucinex.  Return to clinic for new or worse swelling or redness in your face, around your eyes or new high fever.  Patient verbalized understanding and agreed with treatment plan.  Patient stable upon discharge. ASSESSMENT/PLAN:  1. Acute sinusitis, recurrence not specified, unspecified location - predniSONE (DELTASONE) 10 MG tablet; Take 6 tablets (60 mg total) by mouth daily for 1 day, THEN 5 tablets (  50 mg total) daily for 1 day, THEN 4 tablets (40 mg total) daily for 1 day, THEN 3 tablets (30 mg total) daily for 1 day, THEN 2 tablets (20  mg total) daily for 1 day, THEN 1 tablet (10 mg total) daily for 1 day.  Dispense: 21 tablet; Refill: 0 - cefdinir (OMNICEF) 300 MG capsule; Take 1 capsule (300 mg total) by mouth 2 (two) times daily for 7 days.  Dispense: 14 capsule; Refill: 0 Instructions about new medications and side effects provided.  Plan:   Discharge Instructions      Start taking prednisone as prescribed today.  Only fill your Cefdinir (antibiotic) prescription if symptoms do not improve over the next 7 days or if they acutely worsen.  Sinusitis often follows a cold. It causes pain and pressure in your head and face.  Rest, push lots of fluids (especially water), and utilize supportive care for symptoms. Breathe warm, moist area from a steamy shower, hot bath, or sink filled with hot water.  Avoid cold, dry air.  Using a humidifier in your home may help.  Follow the directions for cleaning the machine. Put a hot, wet towel or a warm gel pack on your face 3-4 times a day for 5-10 minutes each time. You may take acetaminophen (Tylenol) every 4-6 hours and ibuprofen every 6-8 hours for muscle pain, joint pain, headaches (you may also alternate these medications). Sudafed (pseudophedrine) is sold behind the counter and can help reduce nasal pressure; avoid taking this if you have high blood pressure or feel jittery. Sudafed PE (phenylephrine) can be a helpful, short-term, over-the-counter alternative to limit side effects or if you have high blood pressure.  Flonase nasal spray can help alleviate congestion and sinus pressure. Many patients choose Afrin as a nasal decongestant; do not use for more than 3 days for risk of rebound (increased symptoms after stopping medication).  Saline nasal sprays or rinses can also help nasal congestion (use bottled or sterile water). Warm tea with lemon and honey can sooth sore throat and cough, as can cough drops.   Return to clinic for new or worse swelling or redness in your face  or around your eyes, or if you have a new or higher fever.          Serafina Royals, Leonore 03/06/21 1455

## 2021-11-05 ENCOUNTER — Other Ambulatory Visit: Payer: Self-pay | Admitting: Obstetrics and Gynecology

## 2021-11-05 DIAGNOSIS — Z1231 Encounter for screening mammogram for malignant neoplasm of breast: Secondary | ICD-10-CM

## 2021-12-03 ENCOUNTER — Ambulatory Visit
Admission: RE | Admit: 2021-12-03 | Discharge: 2021-12-03 | Disposition: A | Discharge status: Home / Self Care | Payer: BC Managed Care – PPO | Source: Ambulatory Visit | Attending: Obstetrics and Gynecology | Admitting: Obstetrics and Gynecology

## 2021-12-03 DIAGNOSIS — Z1231 Encounter for screening mammogram for malignant neoplasm of breast: Secondary | ICD-10-CM | POA: Diagnosis not present

## 2021-12-04 ENCOUNTER — Inpatient Hospital Stay
Admission: RE | Admit: 2021-12-04 | Discharge: 2021-12-04 | Disposition: A | Discharge status: Home / Self Care | Payer: Self-pay | Source: Ambulatory Visit | Attending: *Deleted | Admitting: *Deleted

## 2021-12-04 ENCOUNTER — Other Ambulatory Visit: Payer: Self-pay | Admitting: *Deleted

## 2021-12-04 DIAGNOSIS — Z1231 Encounter for screening mammogram for malignant neoplasm of breast: Secondary | ICD-10-CM

## 2022-01-29 DIAGNOSIS — R7303 Prediabetes: Secondary | ICD-10-CM | POA: Insufficient documentation

## 2022-08-19 DIAGNOSIS — F418 Other specified anxiety disorders: Secondary | ICD-10-CM | POA: Insufficient documentation

## 2022-11-23 IMAGING — MG MM DIGITAL SCREENING BILAT W/ TOMO AND CAD
6 of 10 series · 6 of 30 positions shown · non-contrast
Comparison: Previous exam(s).

CLINICAL DATA: Screening.

EXAM:
DIGITAL SCREENING BILATERAL MAMMOGRAM WITH TOMOSYNTHESIS AND CAD
TECHNIQUE: Bilateral screening digital craniocaudal and mediolateral oblique
mammograms were obtained. Bilateral screening digital breast
tomosynthesis was performed. The images were evaluated with
computer-aided detection.

[L CC synth-2D (1 of 2)]
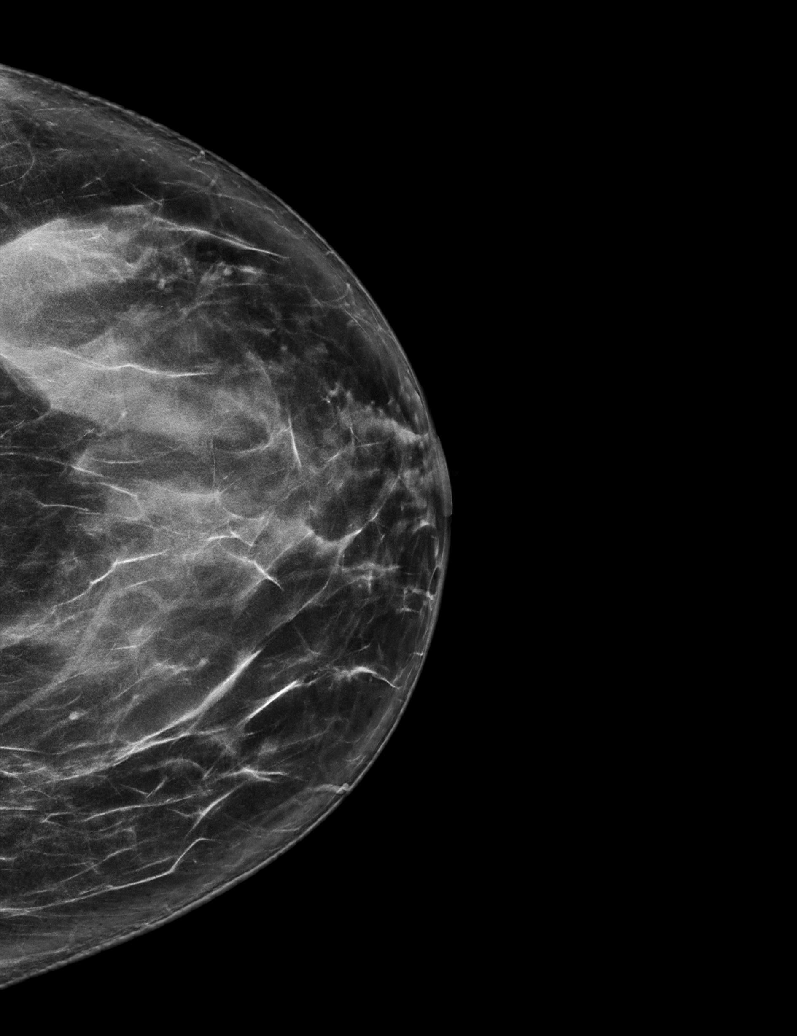

[R CC synth-2D]
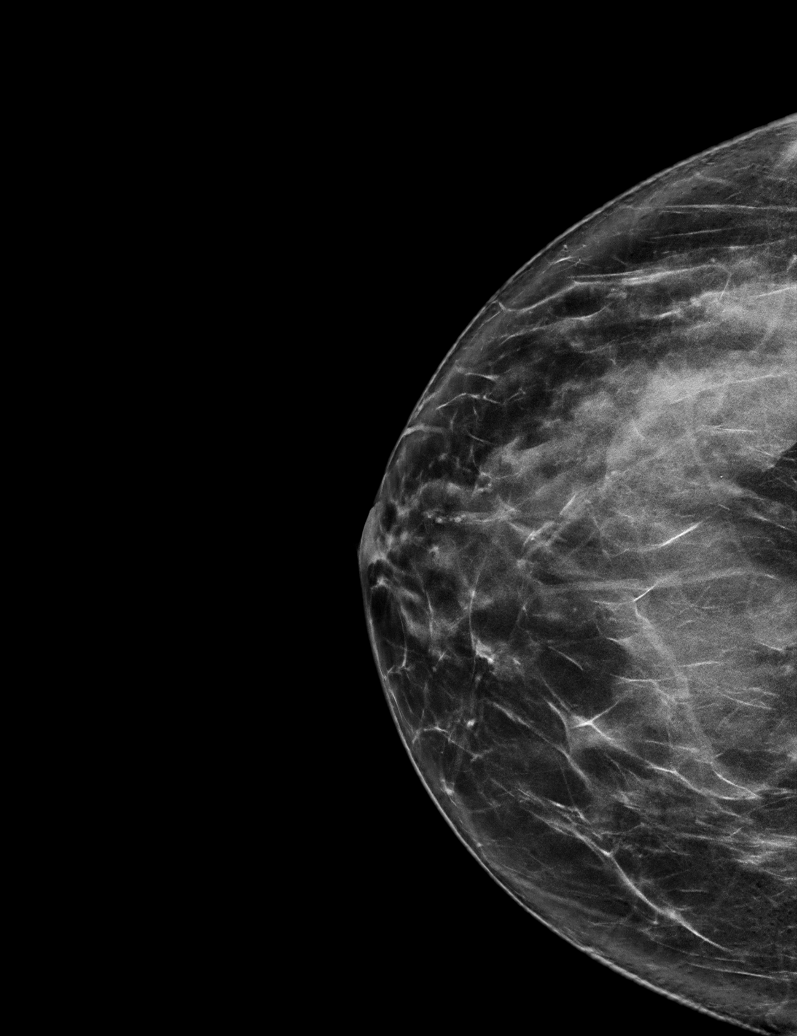

[L CC synth-2D (2 of 2)]
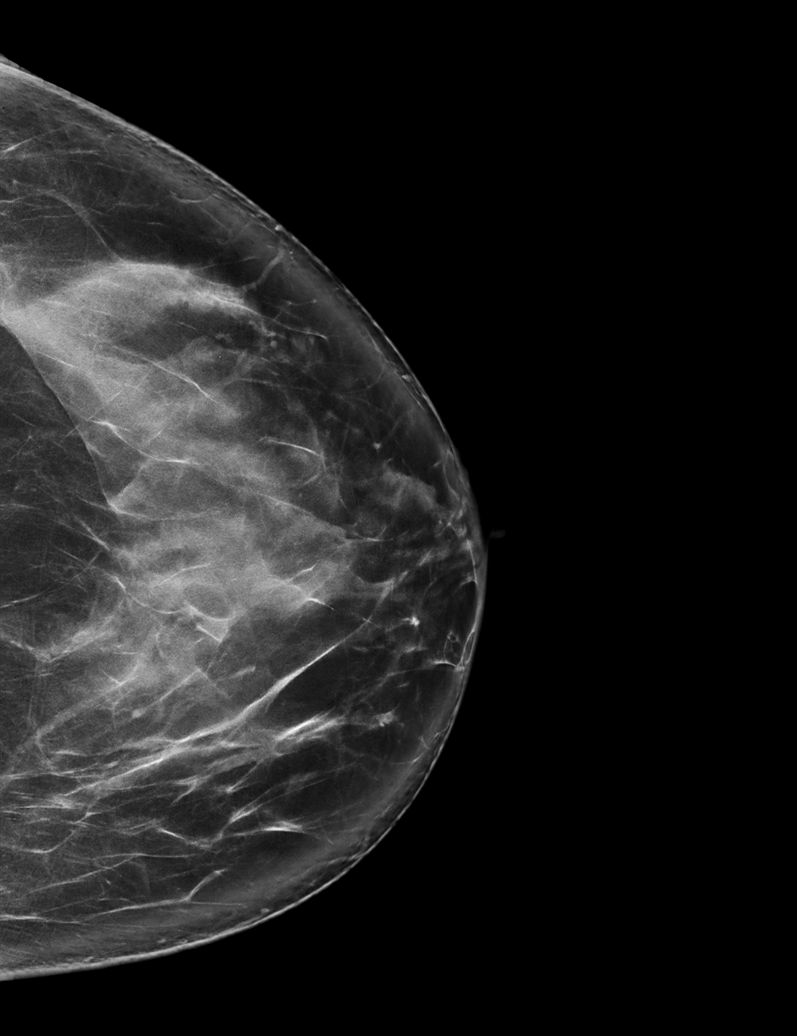

[L MLO synth-2D]
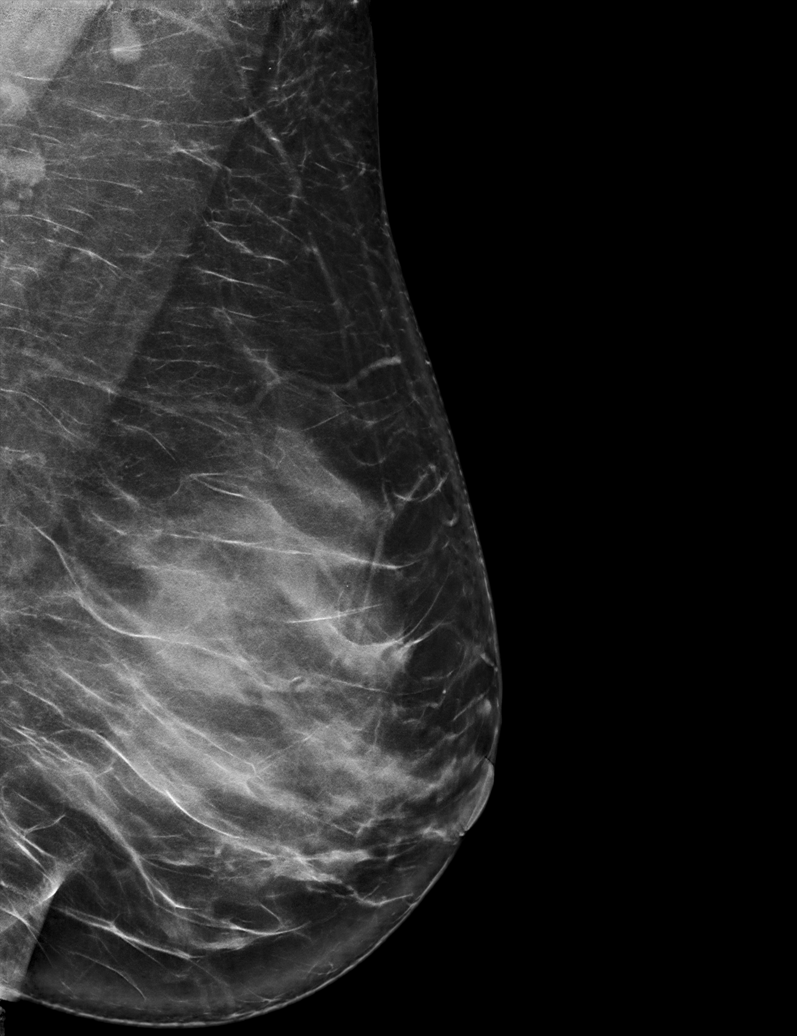

[R MLO synth-2D]
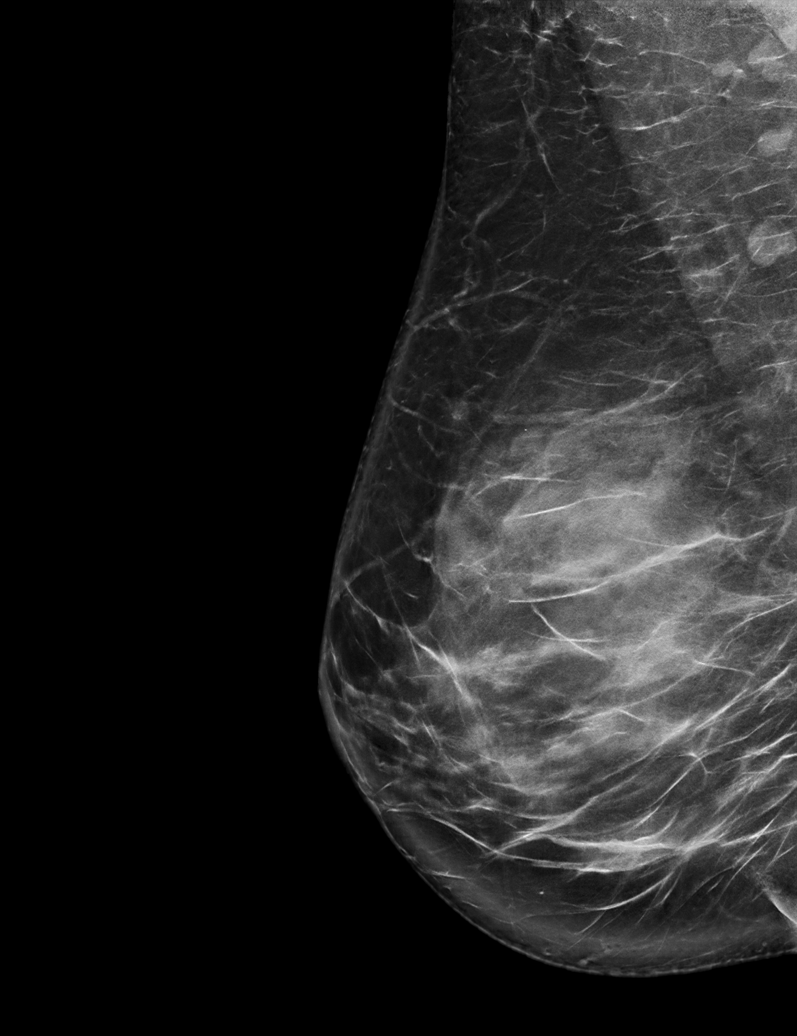

[L CC tomo · tomo slice 41/81.0]
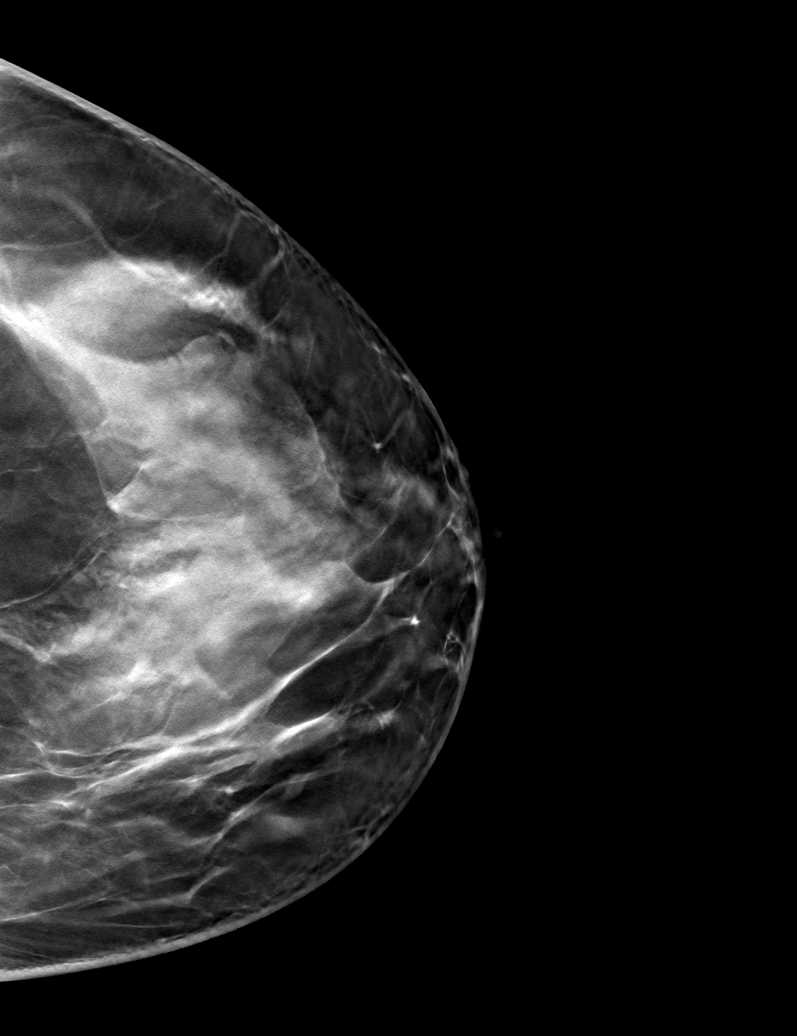

[6 of 30 positions shown; findings below may reference images not displayed]

ACR Breast Density Category d: The breast tissue is extremely dense,
which lowers the sensitivity of mammography
FINDINGS: There are no findings suspicious for malignancy.
IMPRESSION: No mammographic evidence of malignancy. A result letter of this
screening mammogram will be mailed directly to the patient.

RECOMMENDATION:
Screening mammogram in one year. (Code:TA-[id number])

BI-RADS CATEGORY  1: Negative.

## 2023-05-08 ENCOUNTER — Other Ambulatory Visit: Payer: Self-pay | Admitting: Family Medicine

## 2023-05-08 DIAGNOSIS — Z1231 Encounter for screening mammogram for malignant neoplasm of breast: Secondary | ICD-10-CM

## 2023-06-02 ENCOUNTER — Ambulatory Visit
Admission: RE | Admit: 2023-06-02 | Discharge: 2023-06-02 | Disposition: A | Discharge status: Home / Self Care | Payer: BC Managed Care – PPO | Source: Ambulatory Visit | Attending: Family Medicine | Admitting: Family Medicine

## 2023-06-02 DIAGNOSIS — Z1231 Encounter for screening mammogram for malignant neoplasm of breast: Secondary | ICD-10-CM | POA: Insufficient documentation

## 2023-07-07 DIAGNOSIS — E7800 Pure hypercholesterolemia, unspecified: Secondary | ICD-10-CM | POA: Insufficient documentation

## 2023-08-12 ENCOUNTER — Encounter: Payer: Self-pay | Admitting: Pediatrics

## 2023-08-12 ENCOUNTER — Other Ambulatory Visit: Payer: Self-pay | Admitting: Pediatrics

## 2023-08-12 ENCOUNTER — Ambulatory Visit: Payer: Medicaid Other | Admitting: Pediatrics

## 2023-08-12 ENCOUNTER — Ambulatory Visit: Payer: 59 | Attending: Pediatrics

## 2023-08-12 VITALS — BP 130/84 | HR 76 | Temp 98.3°F | Resp 17 | Ht 63.15 in | Wt 157.4 lb

## 2023-08-12 DIAGNOSIS — R002 Palpitations: Secondary | ICD-10-CM

## 2023-08-12 DIAGNOSIS — Z8249 Family history of ischemic heart disease and other diseases of the circulatory system: Secondary | ICD-10-CM | POA: Diagnosis not present

## 2023-08-12 DIAGNOSIS — Z6827 Body mass index (BMI) 27.0-27.9, adult: Secondary | ICD-10-CM

## 2023-08-12 DIAGNOSIS — N951 Menopausal and female climacteric states: Secondary | ICD-10-CM

## 2023-08-12 DIAGNOSIS — Z1330 Encounter for screening examination for mental health and behavioral disorders, unspecified: Secondary | ICD-10-CM

## 2023-08-12 DIAGNOSIS — Z7689 Persons encountering health services in other specified circumstances: Secondary | ICD-10-CM

## 2023-08-12 DIAGNOSIS — I10 Essential (primary) hypertension: Secondary | ICD-10-CM | POA: Diagnosis not present

## 2023-08-12 DIAGNOSIS — R232 Flushing: Secondary | ICD-10-CM

## 2023-08-12 MED ORDER — TOPIRAMATE 25 MG PO CPSP: 25.0000 mg | ORAL_CAPSULE | Freq: Two times a day (BID) | ORAL | 1 refills | Status: DC

## 2023-08-12 MED ORDER — OLMESARTAN MEDOXOMIL 20 MG PO TABS: 20.0000 mg | ORAL_TABLET | Freq: Every day | ORAL | 2 refills | Status: DC

## 2023-08-12 NOTE — Patient Instructions (Addendum)
 Topimax 25mg  for weight management nightly, if going well after a week can take 50mg  (either twice at night or one in the am and one in pm)  Switch to olmesartan 20mg  from losartan

## 2023-08-12 NOTE — Progress Notes (Unsigned)
 Establish Care Note  BP 130/84 (BP Location: Left Arm, Patient Position: Sitting, Cuff Size: Normal)   Pulse 76   Temp 98.3 F (36.8 C) (Oral)   Resp 17   Ht 5' 3.15" (1.604 m)   Wt 157 lb 6.4 oz (71.4 kg)   LMP 07/18/2023 (Exact Date)   SpO2 97%   BMI 27.75 kg/m    Subjective:    Patient ID: Desiree Woodward, female    DOB: [birth year], 43 y.o.   MRN: [id number]  HPI: Desiree Woodward is a 43 y.o. female  Chief Complaint  Patient presents with   Establish Care    Previous PCP listed as referring provider   hormonal health   Heart Problem    Has a long standing family history and would like a calcium scoring      Establishing care, the following was discussed today:  Discussed the use of AI scribe software for clinical note transcription with the patient, who gave verbal consent to proceed.  History of Present Illness   Desiree Woodward is a 43 year old female with hypertension who presents with concerns about heart health and perimenopausal symptoms.  She is concerned about her heart health due to a significant family history of heart disease. She has hypertension for the past three years and experiences episodes of tachycardia, with heart rates reaching up to 130 bpm upon minimal exertion, such as bending over. Her resting heart rate typically ranges from 90 to 110 bpm. She has not previously consulted a cardiologist but underwent a stress test and a 48-hour Holter monitor approximately three years ago, though the results were not specified. She wants to avoid becoming a 'statistic' of heart disease, as many family members have had heart attacks and bypass surgeries, including her father, who had a triple bypass at 30 and died at 14, and her brother, who has an enlarged heart with a 20% ejection fraction at age 39.  She has been on hydrochlorothiazide for the past three years and recently started losartan in January. She previously tried amlodipine but did not find it  effective in controlling her blood pressure, which remains in the 130s to 140s systolic and 90s diastolic range. She takes hydrochlorothiazide in the morning and losartan at night.  She reports symptoms suggestive of perimenopause, including hot flashes, irregular menstrual cycles, breast tenderness, and sleep disturbances. Her periods vary from three to eight weeks apart. She feels tired frequently and attributes this to her heart issues and perimenopausal symptoms. She has gained 15 pounds over the past 8 to 10 months despite dietary improvements and increased water intake. She experiences shortness of breath and a sensation of heaviness, which she attributes to weight gain and possibly her heart condition.  Her family history is significant for heart disease, with her father, grandfather, grandmother, and several aunts and uncles having had heart attacks and bypass surgeries. Her brother has an enlarged heart with a low ejection fraction. Her husband had a heart attack at 4, leading to a triple bypass surgery due to multiple blockages.        Current Outpatient Medications on File Prior to Visit  Medication Sig Dispense Refill   hydrochlorothiazide (HYDRODIURIL) 25 MG tablet Take 25 mg by mouth daily.     omeprazole (PRILOSEC) 20 MG capsule Take 1 capsule by mouth daily as needed.     amLODipine (NORVASC) 5 MG tablet Take 1 tablet (5 mg total) by mouth daily. (Patient not taking: Reported on  08/12/2023) 30 tablet 1   No current facility-administered medications on file prior to visit.    #HM Will review HM records and updated as needed.  Relevant past medical, surgical, family and social history reviewed and updated as indicated. Interim medical history since our last visit reviewed. Allergies and medications reviewed and updated.  ROS per HPI unless specifically indicated above     Objective:    BP 130/84 (BP Location: Left Arm, Patient Position: Sitting, Cuff Size: Normal)   Pulse  76   Temp 98.3 F (36.8 C) (Oral)   Resp 17   Ht 5' 3.15" (1.604 m)   Wt 157 lb 6.4 oz (71.4 kg)   LMP 07/18/2023 (Exact Date)   SpO2 97%   BMI 27.75 kg/m   Wt Readings from Last 3 Encounters:  08/12/23 157 lb 6.4 oz (71.4 kg)  10/05/20 150 lb (68 kg)  10/03/20 150 lb (68 kg)     Physical Exam Constitutional:      Appearance: Normal appearance.  HENT:     Head: Normocephalic and atraumatic.  Eyes:     Pupils: Pupils are equal, round, and reactive to light.  Cardiovascular:     Rate and Rhythm: Normal rate and regular rhythm.     Pulses: Normal pulses.     Heart sounds: Normal heart sounds.  Pulmonary:     Effort: Pulmonary effort is normal.     Breath sounds: Normal breath sounds.  Abdominal:     General: Abdomen is flat.     Palpations: Abdomen is soft.  Musculoskeletal:        General: Normal range of motion.     Cervical back: Normal range of motion.  Skin:    General: Skin is warm and dry.     Capillary Refill: Capillary refill takes less than 2 seconds.  Neurological:     General: No focal deficit present.     Mental Status: She is alert. Mental status is at baseline.  Psychiatric:        Mood and Affect: Mood normal.        Behavior: Behavior normal.         08/12/2023    3:31 PM  Depression screen PHQ 2/9  Decreased Interest 0  Down, Depressed, Hopeless 0  PHQ - 2 Score 0  Altered sleeping 2  Tired, decreased energy 3  Change in appetite 0  Feeling bad or failure about yourself  0  Trouble concentrating 0  Moving slowly or fidgety/restless 0  Suicidal thoughts 0  PHQ-9 Score 5  Difficult doing work/chores Somewhat difficult        08/12/2023    3:31 PM  GAD 7 : Generalized Anxiety Score  Nervous, Anxious, on Edge 0  Control/stop worrying 0  Worry too much - different things 0  Trouble relaxing 0  Restless 0  Easily annoyed or irritable 0  Afraid - awful might happen 0  Total GAD 7 Score 0  Anxiety Difficulty Not difficult at all        Assessment & Plan:  Assessment & Plan   Essential hypertension Assessment & Plan: On Losartan and Hydrochlorothiazide, but blood pressure consistently borderline to high. Discussed limitations of Losartan due to short-acting nature and potential for inconsistent blood pressure control. -Discontinue Losartan. -Start Olmesartan, a long-acting medication in the same class, with potential for more consistent blood pressure control.  Orders: -     Olmesartan Medoxomil; Take 1 tablet (20 mg total) by mouth daily.  Dispense: 30 tablet; Refill: 2  Perimenopause Hot flashes Assessment & Plan: Reports symptoms of hot flashes, irregular periods, and breast tenderness. Weight gain over the past 8-10 months. -Start Topiramate to potentially help with hot flashes and appetite control. -Consider further treatment options for perimenopause symptoms if Topiramate is not effective.   BMI 27.0-27.9,adult Assessment & Plan: Reports weight gain of 15-20 pounds over the past 8-10 months despite improved diet and hydration habits. -Continue with lifestyle modifications. -Start Topiramate for potential appetite control and weight management. -Consider further weight management options if Topiramate is not effective.   Family history of MI (myocardial infarction) Assessment & Plan: Significant family history of heart disease. -Order coronary artery calcium scoring for better assessment of cardiovascular risk.  Orders: -     CT CARDIAC SCORING (SELF PAY ONLY); Future  Palpitations Assessment & Plan: Reports of resting heart rate between 90-110 bpm and spikes to 130 bpm with minimal exertion. Family history of significant heart disease. Previous Holter monitor 3 years ago reportedly normal, but only 48 hours duration. -Order 2-week Holter monitor to better assess for arrhythmias.  Orders: -     CT CARDIAC SCORING (SELF PAY ONLY); Future -     LONG TERM MONITOR (3-14 DAYS);  Future   Encounter to establish care Reviewed available patient record including history, medications, problem list. HM updated as able. Will review and/or request outside records (if applicable) and will fill remaining HM gaps as needed at follow up visit.  Encounter for behavioral health screening As part of their intake evaluation, the patient was screened for depression, anxiety.  PHQ9 SCORE 5, GAD7 SCORE 0. Screening results negative for tested conditions. CTM.   Follow up plan: Return in about 3 weeks (around 09/02/2023).  Jackolyn Confer, MD

## 2023-08-13 NOTE — Telephone Encounter (Signed)
 Requested medication (s) are due for refill today:   This is not covered by insurance, NDC consider tablets  Requested medication (s) are on the active medication list:   N/A  Future visit scheduled:      Last ordered: 08/12/2023 by Dr. Evelene Croon   Requested Prescriptions  Pending Prescriptions Disp Refills   topiramate (TOPAMAX) 25 MG tablet [Pharmacy Med Name: TOPIRAMATE 25MG ] 60 tablet 1    Sig: TAKE 1 TABLET BY MOUTH TWICE DAILY     Neurology: Anticonvulsants - topiramate & zonisamide Failed - 08/13/2023  3:09 PM      Failed - Cr in normal range and within 360 days    Creatinine  Date Value Ref Range Status  01/02/2012 0.62 0.60 - 1.30 mg/dL Final   Creatinine, Ser  Date Value Ref Range Status  10/05/2020 1.02 (H) 0.44 - 1.00 mg/dL Final         Failed - CO2 in normal range and within 360 days    CO2  Date Value Ref Range Status  10/05/2020 27 22 - 32 mmol/L Final   Co2  Date Value Ref Range Status  01/02/2012 23 21 - 32 mmol/L Final         Failed - ALT in normal range and within 360 days    ALT  Date Value Ref Range Status  08/07/2015 42 14 - 54 U/L Final         Failed - AST in normal range and within 360 days    AST  Date Value Ref Range Status  08/07/2015 25 15 - 41 U/L Final   SGOT(AST)  Date Value Ref Range Status  01/02/2012 18 15 - 37 Unit/L Final         Failed - Valid encounter within last 12 months    Recent Outpatient Visits   None     Future Appointments             In 2 weeks Evelene Croon, Atilano Median, MD [facility]            Passed - Completed PHQ-2 or PHQ-9 in the last 360 days

## 2023-08-17 ENCOUNTER — Encounter: Payer: Self-pay | Admitting: Pediatrics

## 2023-08-17 DIAGNOSIS — N951 Menopausal and female climacteric states: Secondary | ICD-10-CM | POA: Insufficient documentation

## 2023-08-17 DIAGNOSIS — R002 Palpitations: Secondary | ICD-10-CM | POA: Insufficient documentation

## 2023-08-17 DIAGNOSIS — Z8249 Family history of ischemic heart disease and other diseases of the circulatory system: Secondary | ICD-10-CM | POA: Insufficient documentation

## 2023-08-17 DIAGNOSIS — Z6827 Body mass index (BMI) 27.0-27.9, adult: Secondary | ICD-10-CM | POA: Insufficient documentation

## 2023-08-17 NOTE — Assessment & Plan Note (Signed)
 On Losartan and Hydrochlorothiazide, but blood pressure consistently borderline to high. Discussed limitations of Losartan due to short-acting nature and potential for inconsistent blood pressure control. -Discontinue Losartan. -Start Olmesartan, a long-acting medication in the same class, with potential for more consistent blood pressure control.

## 2023-08-17 NOTE — Assessment & Plan Note (Signed)
 Reports of resting heart rate between 90-110 bpm and spikes to 130 bpm with minimal exertion. Family history of significant heart disease. Previous Holter monitor 3 years ago reportedly normal, but only 48 hours duration. -Order 2-week Holter monitor to better assess for arrhythmias.

## 2023-08-17 NOTE — Assessment & Plan Note (Signed)
 Reports weight gain of 15-20 pounds over the past 8-10 months despite improved diet and hydration habits. -Continue with lifestyle modifications. -Start Topiramate for potential appetite control and weight management. -Consider further weight management options if Topiramate is not effective.

## 2023-08-17 NOTE — Assessment & Plan Note (Signed)
 Reports symptoms of hot flashes, irregular periods, and breast tenderness. Weight gain over the past 8-10 months. -Start Topiramate to potentially help with hot flashes and appetite control. -Consider further treatment options for perimenopause symptoms if Topiramate is not effective.

## 2023-08-17 NOTE — Assessment & Plan Note (Signed)
 Significant family history of heart disease. -Order coronary artery calcium scoring for better assessment of cardiovascular risk.

## 2023-08-19 ENCOUNTER — Ambulatory Visit
Admission: RE | Admit: 2023-08-19 | Discharge: 2023-08-19 | Disposition: A | Discharge status: Home / Self Care | Payer: Self-pay | Source: Ambulatory Visit | Attending: Pediatrics | Admitting: Pediatrics

## 2023-08-19 DIAGNOSIS — Z8249 Family history of ischemic heart disease and other diseases of the circulatory system: Secondary | ICD-10-CM | POA: Insufficient documentation

## 2023-08-19 DIAGNOSIS — R002 Palpitations: Secondary | ICD-10-CM | POA: Insufficient documentation

## 2023-09-02 ENCOUNTER — Ambulatory Visit: Payer: 59 | Admitting: Pediatrics

## 2023-09-02 ENCOUNTER — Encounter: Payer: Self-pay | Admitting: Pediatrics

## 2023-09-02 VITALS — BP 100/67 | HR 76 | Temp 98.5°F | Resp 16 | Wt 158.0 lb

## 2023-09-02 DIAGNOSIS — I10 Essential (primary) hypertension: Secondary | ICD-10-CM

## 2023-09-02 DIAGNOSIS — N951 Menopausal and female climacteric states: Secondary | ICD-10-CM | POA: Diagnosis not present

## 2023-09-02 DIAGNOSIS — R002 Palpitations: Secondary | ICD-10-CM

## 2023-09-02 DIAGNOSIS — Z1330 Encounter for screening examination for mental health and behavioral disorders, unspecified: Secondary | ICD-10-CM

## 2023-09-02 DIAGNOSIS — Z6827 Body mass index (BMI) 27.0-27.9, adult: Secondary | ICD-10-CM | POA: Diagnosis not present

## 2023-09-02 NOTE — Assessment & Plan Note (Signed)
 Palpitations and possible Postural Orthostatic Tachycardia Syndrome (POTS) Symptoms suggest POTS, especially post-COVID-19. Cardiac CT shows low cardiac disease risk. Awaiting Holter results. Neurology referral for POTS testing considered. - Check thyroid function tests. - Review Holter monitor results once available. - Consider referral to neurology for POTS testing if indicated. - Educate on lifestyle modifications for POTS, including increased sodium intake and hydration.

## 2023-09-02 NOTE — Assessment & Plan Note (Addendum)
 Hypertension controlled with olmesartan and hydrochlorothiazide. Discussed simplifying regimen with combination pill. No significant side effects from olmesartan. Persistent dry mouth possibly due to hydrochlorothiazide. Discussed potential causes and management strategies. - Trial reduction of hydrochlorothiazide to half dose and monitor blood pressure at home. - Consider switching to a combination pill of olmesartan and hydrochlorothiazide if blood pressure remains controlled.

## 2023-09-02 NOTE — Patient Instructions (Addendum)
 Blood pressure- take half of hydrochlorothiazide and let me know where your readings are  Can stop topamax if not helping  Will call after your trip to schedule follow up Will let you know as soon as I get the holter results

## 2023-09-02 NOTE — Assessment & Plan Note (Signed)
 No improvement with Topamax. Discussed alternative treatments but she prefers to delay additional medications. - Discontinue Topamax as it is not effective for craving control

## 2023-09-02 NOTE — Progress Notes (Signed)
 Office Visit  BP 100/67 (BP Location: Left Arm, Patient Position: Sitting, Cuff Size: Normal)   Pulse 76   Temp 98.5 F (36.9 C) (Oral)   Resp 16   Wt 158 lb (71.7 kg)   LMP 07/18/2023 (Exact Date)   SpO2 98%   BMI 27.86 kg/m    Subjective:    Patient ID: Desiree Woodward, female    DOB: 11-25-1980, 43 y.o.   MRN: [id number]  HPI: Desiree Woodward is a 43 y.o. female  Chief Complaint  Patient presents with   heart monitor    Sent back in on Saturday. POTS was a thought my a nurse/cousin   Hypertension    Discussed the use of AI scribe software for clinical note transcription with the patient, who gave verbal consent to proceed.  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  She experiences a racing heart, particularly noticeable when bending over or engaging in physical activities such as gardening or doing laundry, with her heart rate increasing to 140 beats per minute during these activities. She also reports tingling and numbness in her hands and feet, occurring randomly throughout the day, and lightheadedness, though she has not fainted. These symptoms have been worsening over the past few months.  She has had COVID-19 four times, with the first infection being severe. Her symptoms began after the first infection and have persisted since then.  She has a history of hypertension and is currently taking olmesartan and hydrochlorothiazide. Her blood pressure has improved with medication, but she has experienced tiredness for a long time, which she attributes to her high blood pressure.  She mentions a dry mouth, which she has been experiencing for a long time, and is unsure if it worsened with the start of hydrochlorothiazide. She drinks water constantly to manage this symptom.  She is concerned about her weight, stating she is the heaviest she has ever been and  wishes to return to her previous weight of 130 pounds. She wants to 'get back to being myself' and improve her overall health.      Relevant past medical, surgical, family and social history reviewed and updated as indicated. Interim medical history since our last visit reviewed. Allergies and medications reviewed and updated.  ROS per HPI unless specifically indicated above     Objective:    BP 100/67 (BP Location: Left Arm, Patient Position: Sitting, Cuff Size: Normal)   Pulse 76   Temp 98.5 F (36.9 C) (Oral)   Resp 16   Wt 158 lb (71.7 kg)   LMP 07/18/2023 (Exact Date)   SpO2 98%   BMI 27.86 kg/m   Wt Readings from Last 3 Encounters:  09/02/23 158 lb (71.7 kg)  08/12/23 157 lb 6.4 oz (71.4 kg)  10/05/20 150 lb (68 kg)     Physical Exam Constitutional:      Appearance: Normal appearance.  HENT:     Head: Normocephalic and atraumatic.  Eyes:     Pupils: Pupils are equal, round, and reactive to light.  Cardiovascular:     Rate and Rhythm: Normal rate and regular rhythm.     Pulses: Normal pulses.     Heart sounds: Normal heart sounds.  Pulmonary:     Effort: Pulmonary effort is normal.     Breath sounds: Normal breath sounds.  Musculoskeletal:        General: Normal  range of motion.     Cervical back: Normal range of motion.  Skin:    General: Skin is warm and dry.  Neurological:     General: No focal deficit present.     Mental Status: She is alert. Mental status is at baseline.  Psychiatric:        Mood and Affect: Mood normal.        Behavior: Behavior normal.         09/02/2023    9:57 AM 08/12/2023    3:31 PM  Depression screen PHQ 2/9  Decreased Interest 0 0  Down, Depressed, Hopeless 0 0  PHQ - 2 Score 0 0  Altered sleeping 2 2  Tired, decreased energy 2 3  Change in appetite 1 0  Feeling bad or failure about yourself  0 0  Trouble concentrating 1 0  Moving slowly or fidgety/restless 0 0  Suicidal thoughts 0 0  PHQ-9 Score 6 5  Difficult  doing work/chores Not difficult at all Somewhat difficult       09/02/2023    9:57 AM 08/12/2023    3:31 PM  GAD 7 : Generalized Anxiety Score  Nervous, Anxious, on Edge 0 0  Control/stop worrying 0 0  Worry too much - different things 0 0  Trouble relaxing 1 0  Restless 0 0  Easily annoyed or irritable 0 0  Afraid - awful might happen 0 0  Total GAD 7 Score 1 0  Anxiety Difficulty Not difficult at all Not difficult at all       Assessment & Plan:  Assessment & Plan   Palpitations Assessment & Plan: Palpitations and possible Postural Orthostatic Tachycardia Syndrome (POTS) Symptoms suggest POTS, especially post-COVID-19. Cardiac CT shows low cardiac disease risk. Awaiting Holter results. Neurology referral for POTS testing considered. - Check thyroid function tests. - Review Holter monitor results once available. - Consider referral to neurology for POTS testing if indicated. - Educate on lifestyle modifications for POTS, including increased sodium intake and hydration.  Orders: -     Thyroid Panel With TSH  Essential hypertension Assessment & Plan: Hypertension controlled with olmesartan and hydrochlorothiazide. Discussed simplifying regimen with combination pill. No significant side effects from olmesartan. Persistent dry mouth possibly due to hydrochlorothiazide. Discussed potential causes and management strategies. - Trial reduction of hydrochlorothiazide to half dose and monitor blood pressure at home. - Consider switching to a combination pill of olmesartan and hydrochlorothiazide if blood pressure remains controlled.   BMI 27.0-27.9,adult Assessment & Plan: No improvement with Topamax. Discussed alternative treatments but she prefers to delay additional medications. - Discontinue Topamax as it is not effective for craving control   Encounter for behavioral health screening As part of their intake evaluation, the patient was screened for depression, anxiety.  PHQ9  SCORE 6, GAD7 SCORE 1. Screening results negative for tested conditions.CTM.   Follow up plan: Return if symptoms worsen or fail to improve.  Jackolyn Confer, MD

## 2023-09-03 LAB — THYROID PANEL WITH TSH
Free Thyroxine Index: 2.2 (ref 1.2–4.9)
T3 Uptake Ratio: 24 % (ref 24–39)
T4, Total: 9 ug/dL (ref 4.5–12.0)
TSH: 1.08 u[IU]/mL (ref 0.450–4.500)

## 2023-09-13 DIAGNOSIS — R002 Palpitations: Secondary | ICD-10-CM | POA: Diagnosis not present

## 2023-09-16 ENCOUNTER — Other Ambulatory Visit: Payer: Self-pay | Admitting: Pediatrics

## 2023-09-16 DIAGNOSIS — I10 Essential (primary) hypertension: Secondary | ICD-10-CM

## 2023-09-16 MED ORDER — OLMESARTAN MEDOXOMIL-HCTZ 20-12.5 MG PO TABS: 1.0000 | ORAL_TABLET | Freq: Every day | ORAL | 3 refills | Status: DC

## 2023-09-16 NOTE — Progress Notes (Signed)
 Sent combo pill, benicar/hydrochlorothiazide  Jackolyn Confer, MD

## 2023-09-22 ENCOUNTER — Other Ambulatory Visit: Payer: Self-pay | Admitting: Pediatrics

## 2023-09-22 DIAGNOSIS — R42 Dizziness and giddiness: Secondary | ICD-10-CM

## 2023-09-22 NOTE — Progress Notes (Signed)
 Requesting Tilt table test for POTS  Jackolyn Confer, MD

## 2023-10-23 ENCOUNTER — Other Ambulatory Visit: Payer: Self-pay | Admitting: Pediatrics

## 2023-10-23 DIAGNOSIS — R682 Dry mouth, unspecified: Secondary | ICD-10-CM

## 2023-10-23 DIAGNOSIS — K117 Disturbances of salivary secretion: Secondary | ICD-10-CM

## 2023-10-23 MED ORDER — PILOCARPINE HCL 5 MG PO TABS: 5.0000 mg | ORAL_TABLET | Freq: Four times a day (QID) | ORAL | 1 refills | Status: DC | PRN

## 2023-10-23 NOTE — Progress Notes (Signed)
 Sent pilocarpine for xerostomia  Hadassah Letters, MD

## 2023-11-03 ENCOUNTER — Other Ambulatory Visit: Payer: Self-pay | Admitting: Pediatrics

## 2023-11-03 DIAGNOSIS — R682 Dry mouth, unspecified: Secondary | ICD-10-CM

## 2023-11-03 MED ORDER — PILOCARPINE HCL 5 MG PO TABS: 5.0000 mg | ORAL_TABLET | Freq: Three times a day (TID) | ORAL | 1 refills | Status: DC | PRN

## 2023-11-03 NOTE — Progress Notes (Signed)
 Requesting 90 days of the salagen .  Desiree Letters, MD

## 2023-11-11 ENCOUNTER — Other Ambulatory Visit: Payer: Self-pay | Admitting: Pediatrics

## 2023-11-11 DIAGNOSIS — R682 Dry mouth, unspecified: Secondary | ICD-10-CM

## 2023-11-13 NOTE — Telephone Encounter (Signed)
 Attempted to contact [facility] pharmacy x 2 to confirm receipt of medication from 11/03/23 at 3:40 pm. Recording states having trouble, you will have to call again and hangs up.

## 2023-11-13 NOTE — Telephone Encounter (Signed)
 Requested by interface surescripts. Medication signed 11/03/23. Receipt confirmed by pharmacy 11/03/23 at 3:40 pm. Duplicate request.  Requested Prescriptions  Refused Prescriptions Disp Refills   pilocarpine  (SALAGEN ) 5 MG tablet [Pharmacy Med Name: Pilocarpine  HCl 5 MG Oral Tablet] 30 tablet 0    Sig: TAKE 1 TABLET BY MOUTH EVERY 6 HOURS AS NEEDED (MAX  4  TIMES  DAILY)     Ophthalmology:  Glaucoma Passed - 11/13/2023  2:12 PM      Passed - Valid encounter within last 12 months    Recent Outpatient Visits           2 months ago Palpitations   [place] [facility] Hadassah Letters, MD   3 months ago Essential hypertension   [place] [facility] Hadassah Letters, MD       Future Appointments             In 1 month End, Veryl Gottron, MD [facility]

## 2023-11-17 ENCOUNTER — Other Ambulatory Visit: Payer: Self-pay | Admitting: Pediatrics

## 2023-11-17 DIAGNOSIS — I10 Essential (primary) hypertension: Secondary | ICD-10-CM

## 2023-11-17 DIAGNOSIS — R682 Dry mouth, unspecified: Secondary | ICD-10-CM

## 2023-11-17 NOTE — Telephone Encounter (Unsigned)
 Copied from CRM [id number]. Topic: Clinical - Medication Refill >> Nov 17, 2023  2:00 PM Santiya F wrote: Patient is calling to check the status of the refill requests for olmesartan  (BENICAR ) 20 MG tablet and pilocarpine  (SALAGEN ) 5 MG tablet [[id number]]. Patient is completely out of medication and says her and the pharmacy have requested refills.

## 2023-11-18 NOTE — Telephone Encounter (Signed)
 Requested by interface surescripts. Benicar  discontinued 09/16/23 salagen  last refill 11/03/23 #270 1 refill Requested Prescriptions  Refused Prescriptions Disp Refills   pilocarpine  (SALAGEN ) 5 MG tablet [Pharmacy Med Name: Pilocarpine  HCl 5 MG Oral Tablet] 30 tablet 0    Sig: TAKE 1 TABLET BY MOUTH EVERY 6 HOURS AS NEEDED (MAX  4  TIMES  DAILY)     Ophthalmology:  Glaucoma Passed - 11/18/2023  2:48 PM      Passed - Valid encounter within last 12 months    Recent Outpatient Visits           2 months ago Palpitations   [place] [facility] Hadassah Letters, MD   3 months ago Essential hypertension   [place] [facility] Hadassah Letters, MD       Future Appointments             In 1 month End, Veryl Gottron, MD [place]             olmesartan  (BENICAR ) 20 MG tablet [Pharmacy Med Name: Olmesartan  Medoxomil 20 MG Oral Tablet] 30 tablet 0    Sig: Take 1 tablet by mouth once daily     Cardiovascular:  Angiotensin Receptor Blockers Failed - 11/18/2023  2:48 PM      Failed - Cr in normal range and within 180 days    Creatinine  Date Value Ref Range Status  01/02/2012 0.62 0.60 - 1.30 mg/dL Final   Creatinine, Ser  Date Value Ref Range Status  10/05/2020 1.02 (H) 0.44 - 1.00 mg/dL Final         Failed - K in normal range and within 180 days    Potassium  Date Value Ref Range Status  10/05/2020 3.6 3.5 - 5.1 mmol/L Final  01/02/2012 3.4 (L) 3.5 - 5.1 mmol/L Final         Passed - Patient is not pregnant      Passed - Last BP in normal range    BP Readings from Last 1 Encounters:  09/02/23 100/67         Passed - Valid encounter within last 6 months    Recent Outpatient Visits           2 months ago Palpitations   [place] [facility] Hadassah Letters, MD   3 months ago Essential hypertension   [place] [facility] Hadassah Letters, MD       Future Appointments             In 1  month End, Veryl Gottron, MD [facility]

## 2024-01-06 ENCOUNTER — Ambulatory Visit: Attending: Internal Medicine | Admitting: Internal Medicine

## 2024-01-06 ENCOUNTER — Encounter: Payer: Self-pay | Admitting: Internal Medicine

## 2024-01-06 VITALS — BP 93/73 | HR 71 | Ht 63.0 in | Wt 135.4 lb

## 2024-01-06 DIAGNOSIS — R55 Syncope and collapse: Secondary | ICD-10-CM | POA: Diagnosis not present

## 2024-01-06 DIAGNOSIS — R0609 Other forms of dyspnea: Secondary | ICD-10-CM

## 2024-01-06 DIAGNOSIS — R42 Dizziness and giddiness: Secondary | ICD-10-CM

## 2024-01-06 MED ORDER — OLMESARTAN MEDOXOMIL 20 MG PO TABS: 20.0000 mg | ORAL_TABLET | Freq: Every day | ORAL | 3 refills | Status: DC

## 2024-01-06 NOTE — Progress Notes (Unsigned)
 Cardiology Office Note:  .   Date:  01/07/2024  ID:  Desiree Woodward, DOB 14-May-1981, MRN [id number] PCP: Desiree Hadassah SQUIBB, MD  [facility] Providers Cardiologist:  New     History of Present Illness: .   Desiree Woodward is a 43 y.o. female with history of hypertension and borderline diabetes, who has been referred for evaluation of lightheadedness.  She first developed blood pressure issues about 3 years ago and has had several adjustments to her regimen.  She was previously on losartan and hydrochlorothiazide but had fluctuations in her blood pressure.  This was subsequently consolidated to olmesartan -HCTZ in May.  She feels like her blood pressure overall has been a little better regulated, though she still notes fluctuations and intermittent episodes of lightheadedness, particularly when she stands up quickly.  [name] also notes spikes in her heart rate, up to 150 bpm when walking short distances.  Preceding event monitor showed predominantly sinus rhythm with rare PACs and PVCs but no significant arrhythmia.  [name] has not passed out or fallen though she sometimes needs to hold onto something when she feels lightheaded.  She also notes sporadic numbness and tingling in her arms and the legs that can last anywhere from 1 to 45 minutes.  [name] has not had any frank chest pain but endorses mild shortness of breath when walking in her yard.  She has lost 20 pounds in the last 3 months through a combination of diet and exercise.  She has cut down on her caffeine consumption as well, drinking caffeinated beverages only once every 3 days.  She has not had any edema or bleeding.  She is concerned about a strong family history of cardiovascular disease on her father's side.  ROS: [name] has been dealing with severe dry mouth and was recently started on pilocarpine  with some improvement.  Studies Reviewed: SABRA   EKG Interpretation Date/Time:  Wednesday January 06 2024 10:33:55  EDT Ventricular Rate:  71 PR Interval:  150 QRS Duration:  74 QT Interval:  392 QTC Calculation: 425 R Axis:   25  Text Interpretation: Normal sinus rhythm Low voltage QRS When compared with ECG of 05-Oct-2020 12:52, HEART RATE has decreased Otherwise no significant change Confirmed by Taquilla Downum, Lonni [phone]) on 01/07/2024 7:47:47 AM    Coronary calcium score (08/19/2023): Coronary calcium score 0.  No significant extracardiac findings in the visualized chest.  Event monitor (08/12/2023): Predominantly sinus rhythm with rare supraventricular and ventricular ectopy.  Average heart rate 90 bpm (range 47-182 bpm).  No sustained arrhythmia.  Risk Assessment/Calculations:             Physical Exam:   VS:  BP 93/73   Pulse 71   Ht 5' 3 (1.6 m)   Wt 135 lb 6.4 oz (61.4 kg)   SpO2 99%   BMI 23.99 kg/m    Wt Readings from Last 3 Encounters:  01/06/24 135 lb 6.4 oz (61.4 kg)  09/02/23 158 lb (71.7 kg)  08/12/23 157 lb 6.4 oz (71.4 kg)    Orthostatic VS: Position Blood pressure (mmHg) Heart rate (bpm)  Lying 91/76 76  Sitting 102/70 73  Standing 106/69 84  Standing (3 minutes) 104/75 95   General:  NAD. Neck: No JVD or HJR. Lungs: Clear to auscultation bilaterally without wheezes or crackles. Heart: Regular rate and rhythm without murmurs, rubs, or gallops. Abdomen: Soft, nontender, nondistended. Extremities: No lower extremity edema.  ASSESSMENT AND PLAN: .  Orthostatic lightheadedness and essential hypertension: [name] reports fairly frequent episodes of orthostatic lightheadedness, though her orthostatic vital signs today do not show a blood pressure drop with changing positions.  Modest jump in heart rate noted, though not agnostic of POTS.  Query if recent significant weight loss has amplified effects of her blood pressure medications.  Additionally, HCTZ component places her at risk for intravascular volume depletion.  Recent event monitor did not show significant  arrhythmias.  I have encouraged [name] to remain well-hydrated.  We will switch her olmesartan -HCTZ to olmesartan  20 mg daily without HCTZ.  We will also obtain an echocardiogram to assess for structural abnormalities.  Dyspnea on exertion, hyperlipidemia, and family history of ASCVD: [name] reports a strong family history of cardiovascular disease on her father side of the family.  She herself has moderately elevated LDL though coronary calcium score of 0 earlier this year is reassuring.  I encouraged her to continue with lifestyle modifications to help improve her lipid profile.  I think it would also be prudent to obtain an LP(a) for further risk stratification with her next blood draw.  We will perform an echocardiogram at her convenience given her mild exertional dyspnea as well as aforementioned lightheadedness.    Dispo: Return to clinic in 6 weeks.  Signed, Lonni Hanson, MD

## 2024-01-06 NOTE — Patient Instructions (Signed)
 Medication Instructions:  Your physician recommends the following medication changes.  START TAKING: Olmesartan  20 mg by mouth daily  STOP TAKING: Olmesartan -Hydrochlorothiazide    *If you need a refill on your cardiac medications before your next appointment, please call your pharmacy*  Lab Work: No labs ordered today    Testing/Procedures: Your physician has requested that you have an echocardiogram. Echocardiography is a painless test that uses sound waves to create images of your heart. It provides your doctor with information about the size and shape of your heart and how well your heart's chambers and valves are working.   You may receive an ultrasound enhancing agent through an IV if needed to better visualize your heart during the echo. This procedure takes approximately one hour.  There are no restrictions for this procedure.  This will take place at [address] [facility], [place] [address]  Please note: We ask at that you not bring children with you during ultrasound (echo/ vascular) testing. Due to room size and safety concerns, children are not allowed in the ultrasound rooms during exams. Our front office staff cannot provide observation of children in our lobby area while testing is being conducted. An adult accompanying a patient to their appointment will only be allowed in the ultrasound room at the discretion of the ultrasound technician under special circumstances. We apologize for any inconvenience.   Follow-Up: At [facility] you and your health needs are our priority.  As part of our continuing mission to provide you with exceptional heart care, our providers are all part of one team.  This team includes your primary Cardiologist (physician) and Advanced Practice Providers or APPs (Physician Assistants and Nurse Practitioners) who all work together to provide you with the care you need, when you need it.  Your next appointment:    6 week(s)  Provider:   You may see Lonni Hanson, MD or one of the following Advanced Practice Providers on your designated Care Team:   Lonni Meager, NP Lesley Maffucci, PA-C Bernardino Bring, PA-C Cadence [place], PA-C Tylene Lunch, NP Barnie Hila, NP

## 2024-01-07 ENCOUNTER — Encounter: Payer: Self-pay | Admitting: Internal Medicine

## 2024-02-22 ENCOUNTER — Other Ambulatory Visit

## 2024-03-02 ENCOUNTER — Ambulatory Visit: Admitting: Student

## 2024-03-21 ENCOUNTER — Ambulatory Visit: Attending: Internal Medicine

## 2024-03-21 DIAGNOSIS — R42 Dizziness and giddiness: Secondary | ICD-10-CM | POA: Diagnosis not present

## 2024-03-21 DIAGNOSIS — R0609 Other forms of dyspnea: Secondary | ICD-10-CM

## 2024-03-21 LAB — ECHOCARDIOGRAM COMPLETE
AR max vel: 2.32 cm2
AV Area VTI: 2.01 cm2
AV Area mean vel: 2.11 cm2
AV Mean grad: 3.5 mmHg
AV Peak grad: 6.4 mmHg
Ao pk vel: 1.26 m/s
Area-P 1/2: 3.17 cm2
S' Lateral: 2.5 cm

## 2024-03-23 ENCOUNTER — Ambulatory Visit: Payer: Self-pay | Admitting: Internal Medicine

## 2024-03-29 ENCOUNTER — Ambulatory Visit: Attending: Cardiology | Admitting: Cardiology

## 2024-03-29 ENCOUNTER — Encounter: Payer: Self-pay | Admitting: Cardiology

## 2024-03-29 VITALS — BP 90/60 | HR 98 | Ht 62.0 in | Wt 142.4 lb

## 2024-03-29 DIAGNOSIS — E7800 Pure hypercholesterolemia, unspecified: Secondary | ICD-10-CM | POA: Diagnosis not present

## 2024-03-29 DIAGNOSIS — R42 Dizziness and giddiness: Secondary | ICD-10-CM

## 2024-03-29 DIAGNOSIS — R0609 Other forms of dyspnea: Secondary | ICD-10-CM

## 2024-03-29 DIAGNOSIS — I10 Essential (primary) hypertension: Secondary | ICD-10-CM

## 2024-03-29 DIAGNOSIS — R55 Syncope and collapse: Secondary | ICD-10-CM

## 2024-03-29 NOTE — Progress Notes (Signed)
 Cardiology Office Note   Date:  03/29/2024  ID:  AMADA HALLISEY, DOB April 17, 1981, MRN [id number] PCP: Herold Hadassah SQUIBB, MD  [facility] Providers Cardiologist:  Lonni Hanson, MD     History of Present Illness Desiree Woodward is a 43 y.o. female with past medical history hypertension borderline diabetes, and lightheadedness, who is here today for follow-up.  Was last seen in clinic 01/06/2024 by Dr. Hanson to establish care.  She had first developed blood pressure issues approximately 3 years prior where she had several adjustments to her blood pressure management.  She had previously been on losartan/HCTZ and had fluctuations in her blood pressure.  It was subsequently consolidated to olmesartan  HCTZ.  Since that time she would like a blood pressure overall had been better controlled.  Intermittent episodes of lightheadedness were still apparent when she would stand up quickly.  She also noted spikes in her heart rate up to approximately 150 bpm when walking short distance.  She had worn a heart monitor which showed predominantly sinus rhythm with rare PACs and PVCs but no significant arrhythmia.  With her orthostatic lightheadedness and essential hypertension she was scheduled for an echocardiogram to rule out any structural abnormalities.  She returns to clinic today.  States that she has been doing well from a cardiac perspective.  She continues to have some occasional lightheadedness and dizziness and occasional changes in blood pressure but she feels as though her blood pressure has been better controlled on her current medication regimen.  States that she has a strong family history of cardiovascular disease on her father side.  States that she has been compliant with her current medication regimen.  Denies any hospitalizations or visits to the emergency department.  ROS: 10 point review of system has been reviewed and considered negative except was been listed in the HPI  Studies  Reviewed     2d echo 03/21/2024 1. Left ventricular ejection fraction, by estimation, is 55 to 60%. The  left ventricle has normal function. Left ventricular endocardial border  not optimally defined to evaluate regional wall motion. Left ventricular  diastolic parameters were normal.  The average left ventricular global longitudinal strain is -23.2 %. The  global longitudinal strain is normal.   2. Right ventricular systolic function is normal. The right ventricular  size is normal.   3. The mitral valve is normal in structure. Mild mitral valve  regurgitation. No evidence of mitral stenosis.   Event Monitor (Zio) 09/08/2023 HR 47 - 182, average 90 bpm. Rare supraventricular and ventricular ectopy. No sustained arrhythmias. No atrial fibrillation.  Cardiac Scoring 08/19/2023 IMPRESSION AND RECOMMENDATION: 1. Coronary calcium score of 0.   2. CAC 0, CAC-DRS A0.   3. Continue heart healthy lifestyle and risk factor modification.  Risk Assessment/Calculations           Physical Exam VS:  BP 90/60 (BP Location: Left Arm, Patient Position: Sitting, Cuff Size: Normal)   Pulse 98   Ht 5' 2 (1.575 m)   Wt 142 lb 6.4 oz (64.6 kg)   SpO2 98%   BMI 26.05 kg/m        Wt Readings from Last 3 Encounters:  03/29/24 142 lb 6.4 oz (64.6 kg)  01/06/24 135 lb 6.4 oz (61.4 kg)  09/02/23 158 lb (71.7 kg)    GEN: Well nourished, well developed in no acute distress NECK: No JVD; No carotid bruits CARDIAC: RRR, no murmurs, rubs, gallops RESPIRATORY:  Clear to auscultation without rales,  wheezing or rhonchi  ABDOMEN: Soft, non-tender, non-distended EXTREMITIES:  No edema; No deformity   ASSESSMENT AND PLAN Orthostatic lightheadedness with essential hypertension with a blood pressure today 90/60.  She occasionally has episodic episodes of orthostatic lightheadedness but is not orthostatic or tachycardic today with element of POTS.  She states that her blood pressure has been  well-controlled on her current medication regimen without the HCTZ.  She has been continued on olmesartan  20 mg daily.  She does endorse a lot of quantity of water throughout the day.  Echocardiogram recently completed which reveals no structural abnormalities.  She has been encouraged to continue to monitor pressures 1 to 2 hours postmedication administration at home as well.  Dyspnea on exertion that is unchanged with a recent echocardiogram revealed an LVEF of 55 to 60%, no RWMA, and mild MR.  Will continue to monitor with surveillance echocardiograms.  Hyperlipidemia with an LDL of 150.  She had a calcium score of 0 earlier this year which is reassuring.  She has been scheduled for an updated LP(a) to further risk stratify, with her next blood draw.        Dispo: Patient to return to clinic to see MD/APP in 3 months or sooner if needed for further evaluation.  Signed, Yossef Gilkison, NP

## 2024-03-29 NOTE — Patient Instructions (Signed)
 Medication Instructions:  Your physician recommends that you continue on your current medications as directed. Please refer to the Current Medication list given to you today.  *If you need a refill on your cardiac medications before your next appointment, please call your pharmacy*  Lab Work: No labs ordered today  If you have labs (blood work) drawn today and your tests are completely normal, you will receive your results only by: MyChart Message (if you have MyChart) OR A paper copy in the mail If you have any lab test that is abnormal or we need to change your treatment, we will call you to review the results.  Testing/Procedures: No test ordered today   Follow-Up: At [facility], you and your health needs are our priority.  As part of our continuing mission to provide you with exceptional heart care, our providers are all part of one team.  This team includes your primary Cardiologist (physician) and Advanced Practice Providers or APPs (Physician Assistants and Nurse Practitioners) who all work together to provide you with the care you need, when you need it.  Your next appointment:   3 month(s)  Provider:   You may see Yvonne Kendall, MD or one of the following Advanced Practice Providers on your designated Care Team:   Nicolasa Ducking, NP Ames Dura, PA-C Eula Listen, PA-C Cadence Carpio, PA-C Charlsie Quest, NP Carlos Levering, NP

## 2024-05-31 ENCOUNTER — Telehealth: Payer: Self-pay | Admitting: Pediatrics

## 2024-05-31 ENCOUNTER — Ambulatory Visit: Admitting: Pediatrics

## 2024-05-31 ENCOUNTER — Encounter: Payer: Self-pay | Admitting: Pediatrics

## 2024-05-31 VITALS — BP 101/68 | HR 85 | Temp 98.6°F | Ht 62.5 in | Wt 144.6 lb

## 2024-05-31 DIAGNOSIS — K219 Gastro-esophageal reflux disease without esophagitis: Secondary | ICD-10-CM | POA: Diagnosis not present

## 2024-05-31 DIAGNOSIS — R7303 Prediabetes: Secondary | ICD-10-CM

## 2024-05-31 DIAGNOSIS — R0982 Postnasal drip: Secondary | ICD-10-CM

## 2024-05-31 DIAGNOSIS — I10 Essential (primary) hypertension: Secondary | ICD-10-CM | POA: Diagnosis not present

## 2024-05-31 DIAGNOSIS — R682 Dry mouth, unspecified: Secondary | ICD-10-CM

## 2024-05-31 DIAGNOSIS — Z8249 Family history of ischemic heart disease and other diseases of the circulatory system: Secondary | ICD-10-CM

## 2024-05-31 MED ORDER — FLUTICASONE PROPIONATE 50 MCG/ACT NA SUSP: 2.0000 | Freq: Every day | NASAL | 6 refills | Status: AC

## 2024-05-31 MED ORDER — FAMOTIDINE 20 MG PO TABS: 20.0000 mg | ORAL_TABLET | Freq: Two times a day (BID) | ORAL | 2 refills | Status: AC | PRN

## 2024-05-31 MED ORDER — PILOCARPINE HCL 5 MG PO TABS: 5.0000 mg | ORAL_TABLET | Freq: Four times a day (QID) | ORAL | 1 refills | Status: AC | PRN

## 2024-05-31 MED ORDER — PILOCARPINE HCL 5 MG PO TABS: 5.0000 mg | ORAL_TABLET | Freq: Three times a day (TID) | ORAL | 1 refills | Status: DC | PRN

## 2024-05-31 NOTE — Telephone Encounter (Signed)
 Please review directions for the medication and resend to the pharmacy. Directions read to take 3x daily prn and then max dose of 4 per day.

## 2024-05-31 NOTE — Progress Notes (Unsigned)
° °  Office Visit  BP 101/68   Pulse 85   Temp 98.6 F (37 C) (Oral)   Ht 5' 2.5 (1.588 m)   Wt 144 lb 9.6 oz (65.6 kg)   LMP 05/13/2024 (Exact Date)   SpO2 98%   BMI 26.03 kg/m    Subjective:    Patient ID: Desiree Woodward, female    DOB: [birth year], 43 y.o.   MRN: [id number]  HPI: Desiree Woodward is a 43 y.o. female  Chief Complaint  Patient presents with   Hypertension    Discussed the use of AI scribe software for clinical note transcription with the patient, who gave verbal consent to proceed.  History of Present Illness   Wt Readings from Last 3 Encounters:  05/31/24 144 lb 9.6 oz (65.6 kg)  03/29/24 142 lb 6.4 oz (64.6 kg)  01/06/24 135 lb 6.4 oz (61.4 kg)      Relevant past medical, surgical, family and social history reviewed and updated as indicated. Interim medical history since our last visit reviewed. Allergies and medications reviewed and updated.  ROS per HPI unless specifically indicated above     Objective:    BP 101/68   Pulse 85   Temp 98.6 F (37 C) (Oral)   Ht 5' 2.5 (1.588 m)   Wt 144 lb 9.6 oz (65.6 kg)   LMP 05/13/2024 (Exact Date)   SpO2 98%   BMI 26.03 kg/m   Wt Readings from Last 3 Encounters:  05/31/24 144 lb 9.6 oz (65.6 kg)  03/29/24 142 lb 6.4 oz (64.6 kg)  01/06/24 135 lb 6.4 oz (61.4 kg)     Physical Exam      05/31/2024    1:25 PM 09/02/2023    9:57 AM 08/12/2023    3:31 PM  Depression screen PHQ 2/9  Decreased Interest 0 0 0  Down, Depressed, Hopeless 0 0 0  PHQ - 2 Score 0 0 0  Altered sleeping 0 2 2  Tired, decreased energy 1 2 3   Change in appetite 0 1 0  Feeling bad or failure about yourself  0 0 0  Trouble concentrating 0 1 0  Moving slowly or fidgety/restless 0 0 0  Suicidal thoughts 0 0 0  PHQ-9 Score 1 6  5    Difficult doing work/chores Not difficult at all Not difficult at all Somewhat difficult     Data saved with a previous flowsheet row definition       05/31/2024    1:25 PM 09/02/2023     9:57 AM 08/12/2023    3:31 PM  GAD 7 : Generalized Anxiety Score  Nervous, Anxious, on Edge 0 0 0  Control/stop worrying 0 0 0  Worry too much - different things 0 0 0  Trouble relaxing 0 1 0  Restless 0 0 0  Easily annoyed or irritable 0 0 0  Afraid - awful might happen 0 0 0  Total GAD 7 Score 0 1 0  Anxiety Difficulty Not difficult at all Not difficult at all Not difficult at all       Assessment & Plan:  Assessment & Plan   There are no diagnoses linked to this encounter.   Assessment and Plan Assessment & Plan      Follow up plan: No follow-ups on file.  Hadassah SHAUNNA Nett, MD

## 2024-05-31 NOTE — Telephone Encounter (Signed)
 Copied from CRM #[id number]. Topic: Clinical - Prescription Issue >> May 31, 2024  2:14 PM Delon DASEN wrote: Reason for CRM: pilocarpine  (SALAGEN ) 5 MG tablet- Jasmine with [facility] needs clarification on directions-  [phone]

## 2024-06-01 LAB — CBC WITH DIFFERENTIAL/PLATELET
Basophils Absolute: 0 x10E3/uL (ref 0.0–0.2)
Basos: 0 %
EOS (ABSOLUTE): 0.1 x10E3/uL (ref 0.0–0.4)
Eos: 1 %
Hematocrit: 42.5 % (ref 34.0–46.6)
Hemoglobin: 14.3 g/dL (ref 11.1–15.9)
Immature Grans (Abs): 0 x10E3/uL (ref 0.0–0.1)
Immature Granulocytes: 0 %
Lymphocytes Absolute: 3.1 x10E3/uL (ref 0.7–3.1)
Lymphs: 32 %
MCH: 30.6 pg (ref 26.6–33.0)
MCHC: 33.6 g/dL (ref 31.5–35.7)
MCV: 91 fL (ref 79–97)
Monocytes Absolute: 0.9 x10E3/uL (ref 0.1–0.9)
Monocytes: 9 %
Neutrophils Absolute: 5.5 x10E3/uL (ref 1.4–7.0)
Neutrophils: 58 %
Platelets: 376 x10E3/uL (ref 150–450)
RBC: 4.67 x10E6/uL (ref 3.77–5.28)
RDW: 12.3 % (ref 11.7–15.4)
WBC: 9.5 x10E3/uL (ref 3.4–10.8)

## 2024-06-01 LAB — COMPREHENSIVE METABOLIC PANEL WITH GFR
ALT: 17 IU/L (ref 0–32)
AST: 16 IU/L (ref 0–40)
Albumin: 4.3 g/dL (ref 3.9–4.9)
Alkaline Phosphatase: 80 IU/L (ref 41–116)
BUN/Creatinine Ratio: 16 (ref 9–23)
BUN: 13 mg/dL (ref 6–24)
Bilirubin Total: 0.4 mg/dL (ref 0.0–1.2)
CO2: 25 mmol/L (ref 20–29)
Calcium: 9.8 mg/dL (ref 8.7–10.2)
Chloride: 99 mmol/L (ref 96–106)
Creatinine, Ser: 0.83 mg/dL (ref 0.57–1.00)
Globulin, Total: 2.6 g/dL (ref 1.5–4.5)
Glucose: 89 mg/dL (ref 70–99)
Potassium: 4.3 mmol/L (ref 3.5–5.2)
Sodium: 138 mmol/L (ref 134–144)
Total Protein: 6.9 g/dL (ref 6.0–8.5)
eGFR: 90 mL/min/1.73 (ref >59)

## 2024-06-01 LAB — HEMOGLOBIN A1C
Est. average glucose Bld gHb Est-mCnc: 117 mg/dL
Hgb A1c MFr Bld: 5.7 % — ABNORMAL HIGH (ref 4.8–5.6)

## 2024-06-01 LAB — LIPID PANEL
Chol/HDL Ratio: 3.3 ratio (ref 0.0–4.4)
Cholesterol, Total: 182 mg/dL (ref 100–199)
HDL: 55 mg/dL (ref >39)
LDL Chol Calc (NIH): 98 mg/dL (ref 0–99)
Triglycerides: 167 mg/dL — ABNORMAL HIGH (ref 0–149)
VLDL Cholesterol Cal: 29 mg/dL (ref 5–40)

## 2024-06-02 ENCOUNTER — Ambulatory Visit: Payer: Self-pay | Admitting: Pediatrics

## 2024-06-03 ENCOUNTER — Encounter: Payer: Self-pay | Admitting: Pediatrics

## 2024-06-03 DIAGNOSIS — R682 Dry mouth, unspecified: Secondary | ICD-10-CM | POA: Insufficient documentation

## 2024-06-03 DIAGNOSIS — R0982 Postnasal drip: Secondary | ICD-10-CM | POA: Insufficient documentation

## 2024-06-03 NOTE — Assessment & Plan Note (Addendum)
 Blood pressure stabilized with adjusted olmesartan  dosage. Initial supicion for POTs but suspect now due to over treating HTN.  - Continue current olmesartan 

## 2024-06-03 NOTE — Assessment & Plan Note (Signed)
 Concerns about long-term omeprazole  use. Discussed Pepcid  as an alternative with fewer risks. - Prescribed Pepcid  as needed, up to twice daily.

## 2024-06-03 NOTE — Assessment & Plan Note (Signed)
 Persistent symptoms with environmental triggers. Sinuses swollen without significant pain. - Prescribed Flonase  for daily use. - Consider adding Claritin or Zyrtec if symptoms persist.

## 2024-06-03 NOTE — Assessment & Plan Note (Signed)
Due for repeat blood work

## 2024-06-03 NOTE — Assessment & Plan Note (Signed)
 Pilocarpine  effective for symptom management, usage varies with activity. - Continue pilocarpine  as needed, up to four times daily. - Sent prescription for a 90-day supply of pilocarpine .

## 2024-06-27 ENCOUNTER — Other Ambulatory Visit: Payer: Self-pay

## 2024-06-27 MED ORDER — OLMESARTAN MEDOXOMIL 20 MG PO TABS: 20.0000 mg | ORAL_TABLET | Freq: Every day | ORAL | 3 refills | Status: AC

## 2024-06-29 ENCOUNTER — Ambulatory Visit: Admitting: Internal Medicine

## 2024-09-28 ENCOUNTER — Encounter: Admitting: Nurse Practitioner
